# Patient Record
Sex: Female | Born: 1975 | Race: White | Hispanic: No | Marital: Married | Smoking: Never smoker
Health system: Southern US, Community
[De-identification: ages and names within clinical notes are randomized; demographics above are authoritative.]

## PROBLEM LIST (undated history)

## (undated) DIAGNOSIS — E119 Type 2 diabetes mellitus without complications: Secondary | ICD-10-CM

## (undated) DIAGNOSIS — Z8669 Personal history of other diseases of the nervous system and sense organs: Secondary | ICD-10-CM

## (undated) DIAGNOSIS — N9419 Other specified dyspareunia: Secondary | ICD-10-CM

## (undated) DIAGNOSIS — N83209 Unspecified ovarian cyst, unspecified side: Secondary | ICD-10-CM

## (undated) DIAGNOSIS — T7840XA Allergy, unspecified, initial encounter: Secondary | ICD-10-CM

## (undated) HISTORY — DX: Other specified dyspareunia: N94.19

## (undated) HISTORY — DX: Personal history of other diseases of the nervous system and sense organs: Z86.69

## (undated) HISTORY — DX: Unspecified ovarian cyst, unspecified side: N83.209

## (undated) HISTORY — DX: Type 2 diabetes mellitus without complications: E11.9

## (undated) HISTORY — DX: Allergy, unspecified, initial encounter: T78.40XA

---

## 2015-05-03 DIAGNOSIS — F419 Anxiety disorder, unspecified: Secondary | ICD-10-CM

## 2015-05-03 DIAGNOSIS — F32A Depression, unspecified: Secondary | ICD-10-CM | POA: Insufficient documentation

## 2015-05-03 DIAGNOSIS — I10 Essential (primary) hypertension: Secondary | ICD-10-CM | POA: Insufficient documentation

## 2015-05-03 DIAGNOSIS — F329 Major depressive disorder, single episode, unspecified: Secondary | ICD-10-CM | POA: Insufficient documentation

## 2015-07-04 DIAGNOSIS — J309 Allergic rhinitis, unspecified: Secondary | ICD-10-CM | POA: Insufficient documentation

## 2015-10-19 DIAGNOSIS — B370 Candidal stomatitis: Secondary | ICD-10-CM | POA: Insufficient documentation

## 2015-11-02 ENCOUNTER — Encounter: Payer: Self-pay | Admitting: Sports Medicine

## 2015-11-02 ENCOUNTER — Encounter (INDEPENDENT_AMBULATORY_CARE_PROVIDER_SITE_OTHER): Payer: Self-pay

## 2015-11-02 ENCOUNTER — Ambulatory Visit (INDEPENDENT_AMBULATORY_CARE_PROVIDER_SITE_OTHER): Payer: BLUE CROSS/BLUE SHIELD | Admitting: Sports Medicine

## 2015-11-02 ENCOUNTER — Ambulatory Visit (INDEPENDENT_AMBULATORY_CARE_PROVIDER_SITE_OTHER): Payer: BLUE CROSS/BLUE SHIELD

## 2015-11-02 VITALS — BP 143/82 | HR 81 | Resp 18

## 2015-11-02 DIAGNOSIS — E1165 Type 2 diabetes mellitus with hyperglycemia: Secondary | ICD-10-CM | POA: Diagnosis not present

## 2015-11-02 DIAGNOSIS — IMO0002 Reserved for concepts with insufficient information to code with codable children: Secondary | ICD-10-CM | POA: Insufficient documentation

## 2015-11-02 DIAGNOSIS — M7672 Peroneal tendinitis, left leg: Secondary | ICD-10-CM | POA: Diagnosis not present

## 2015-11-02 DIAGNOSIS — IMO0001 Reserved for inherently not codable concepts without codable children: Secondary | ICD-10-CM

## 2015-11-02 DIAGNOSIS — R52 Pain, unspecified: Secondary | ICD-10-CM

## 2015-11-02 DIAGNOSIS — J988 Other specified respiratory disorders: Secondary | ICD-10-CM | POA: Insufficient documentation

## 2015-11-02 MED ORDER — METHYLPREDNISOLONE 4 MG PO TBPK: ORAL_TABLET | ORAL | 0 refills | Status: DC

## 2015-11-02 MED ORDER — DICLOFENAC SODIUM 75 MG PO TBEC: 75.0000 mg | DELAYED_RELEASE_TABLET | Freq: Two times a day (BID) | ORAL | 0 refills | Status: DC

## 2015-11-02 NOTE — Progress Notes (Signed)
Subjective: Lori Stephens is a 40 y.o. female patient who presents to office for evaluation of left foot pain. Patient complains of progressive pain since the end of July. Reports that it is difficult to walk and really injured foot when she was on vacation where she rolled the ankle and has had pain on the lateral side of her foot and ankle. Reports that she was treated by Dr. Thomasena Stephens, who x-rayed her and also gave her a Kenalog shot in her Botox states that the shot helped for a few days but then pain recurred and re-flared states that nothing up until this point has helped currently uses Nike tennis shoes. Admits to a history of plantar fasciitis. States that pain that she currently has on the side of her left foot is sharp, stabbing with walking and throbbing with sitting. States that pain is persistent and has not improved.   Patient admits to history of Diabetes that is uncontrolled and is going to see a Endocrinologist; blood sugars ranges from 300-500 in the evening.  Review of systems as in nursing note   There are no active problems to display for this patient.   No current outpatient prescriptions on file prior to visit.   No current facility-administered medications on file prior to visit.     Allergies  Allergen Reactions  . Guaifenesin Itching  . Prednisone Other (See Comments)    Makes her emotional/cries alot Makes her emotional/cries alot Insomnia  . Clarithromycin Nausea And Vomiting  . Codeine Itching    Hives    Objective:  General: Alert and oriented x3 in no acute distress  Dermatology: No open lesions bilateral lower extremities, no webspace macerations, no ecchymosis bilateral, all nails x 10 are well manicured.  Vascular: Dorsalis Pedis and Posterior Tibial pedal pulses palpable, Capillary Fill Time 3 seconds,(+) pedal hair growth bilateral, no Focal edema bilateral lower extremities, Temperature gradient within normal limits.  Neurology: Lori Stephens sensation  intact via light touch bilateral, Protective sensation intact with Lori Stephens Monofilament to all pedal sites, Position sense intact, vibratory slightly diminished bilateral.  Musculoskeletal: Moderate tenderness with palpation peroneal tendon course left foot with associated weakness on range of motion, there is mild guarding to left foot and ankle pain, worse with stressing midtarsal joint on left.,No pain with calf compression bilateral.   Gait: Antalgic gait  Xrays  Left Foot   Impression: Normal osseous mineralization. There is significant calcaneal spur. No fracture or dislocation, soft tissue margins within normal limits. No other acute findings.  Assessment and Plan: Problem List Items Addressed This Visit    None    Visit Diagnoses    Pain    -  Primary   Relevant Medications   methylPREDNISolone (MEDROL DOSEPAK) 4 MG TBPK tablet   diclofenac (VOLTAREN) 75 MG EC tablet   Other Relevant Orders   DG Foot Complete Left   Peroneal tendonitis of left lower extremity       Possible tear   Relevant Medications   methylPREDNISolone (MEDROL DOSEPAK) 4 MG TBPK tablet   diclofenac (VOLTAREN) 75 MG EC tablet   Uncontrolled diabetes mellitus type 2 without complications, unspecified long term insulin use status (HCC)       Relevant Medications   glipiZIDE (GLUCOTROL XL) 10 MG 24 hr tablet   LANTUS 100 UNIT/ML injection   aspirin EC 81 MG tablet      -Complete examination performed -Xrays reviewed -Discussed treatement options For peroneal tendinitis versus tear, left foot -Ordered MRI  eval for possible tear peroneal tendon, left foot -Rx diclofenac to start after Medrol dose pack is completed -Dispense short cam boot to use when attempting ambulation at all times to protect left foot -Recommend rest, protection, ice, elevation daily -Temporary handicap permit given. Advised patient to refrain from excessive walking, especially with college classes -Patient to return to  office after MRI or sooner if condition worsens.  Lori Islamitorya Lori Stephens, DPM

## 2015-11-02 NOTE — Patient Instructions (Signed)
Walking Boot °A walking boot (controlled ankle motion boot or CAM walker) is a removable boot-shaped splint that holds your foot or ankle in place after an injury or a medical procedure. This helps with healing and prevents further injury. A walking boot has a stiff, rigid outer frame that limits movement and supports your leg and foot. The inner lining is a layer of padded material. Walking boots usually have several adjustable straps to secure them over the foot. °Your health care provider may prescribe a walking boot if it is okay for you to use your injured foot to support your body weight. How much you can walk with the boot on will depend on the type and severity of your injury. Your health care provider will recommend the best boot for you based on your condition. °HOW DO I PUT ON MY WALKING BOOT? °There are different types of walking boots. Each type of boot has specific instructions about how to wear it properly. Follow instructions from your health care provider about wearing yours. In general: °· Sit down to put on your boot. This is more comfortable and it helps to prevent falls. °· Open up the boot fully. Place your foot into the boot so that your heel rests against the back. °· Your toes should be supported by the base of the boot, but they should not hang over the front. °· Adjust the straps so the boot fits securely but is not too tight. °· Do not bend the hard frame of the boot to get a good fit. °· Ask someone to help you put on the boot, if needed. °WHAT ARE SOME TIPS FOR WALKING WITH A WALKING BOOT? °· Do not try to walk without wearing the boot unless your health care provider has approved. °· Rest your injured leg as much as possible. °· Use other assistive walking devices as told by your health care provider. These include crutches and canes. °· On your other foot, wear a shoe with a heel that is close to the height of the boot. °· Be very careful when walking on surfaces that are uneven or  wet. °HOW CAN I REDUCE SWELLING? °· Rest your injured foot or leg as much as possible. °· If directed, apply ice to the injured area: °¨ Put ice in a plastic bag. °¨ Place a towel between your skin and the bag. °¨ Leave the ice on for 20 minutes, 2-3 times a day for two days or as told by your health care provider. °· Keep your injured leg raised (elevated) above the level of your heart for 2-3 hours each day or as told by your health care provider. °· If swelling gets worse, loosen the boot and rest and raise your foot. °· Contact your health care provider if swelling does not get better or if it gets worse over time. °WHAT SKIN CARE PRACTICES SHOULD I FOLLOW? °· Wear a long sock to protect your foot and leg from rubbing inside the boot. °· Take off the boot one time per day to check the injured area. °· Follow instructions from your health care provider about taking care of your incision or wound, if this applies. °· Clean and wash the injured area as told by your health care provider. °· Gently dry your foot and leg before putting the boot back on. °· Contact your health care provider if a wound is getting worse or if your skin becomes red, painful, or irritated. °ARE THERE ANY ACTIVITY RESTRICTIONS? °Activity   restrictions depend on the type and severity of your injury. Follow instructions from your health care provider. °· Bathe and shower as directed by your health care provider. °· Do not do activities that could make your injury worse. °· Do not drive if your affected foot is one that you usually use for driving. °HOW SHOULD I KEEP MY BOOT CLEAN? °· Clean the frame and the liner of the boot by hand. Use a washcloth with mild soap and water. °· Do not use chemical cleaning products. These could irritate your skin, especially if you have a wound or an incision. °· Do not soak the liner of the boot. °· Do not put any part of the boot in a washing machine or a clothes dryer. °· Allow the boot to air dry  completely before you put it back on your foot. °  °This information is not intended to replace advice given to you by your health care provider. Make sure you discuss any questions you have with your health care provider. °  °Document Released: 06/27/2014 Document Reviewed: 06/27/2014 °Elsevier Interactive Patient Education ©2016 [employer] ° °

## 2015-11-02 NOTE — Progress Notes (Signed)
   Subjective:    Patient ID: Lori Stephens, female    DOB: 1975/10/18, 40 y.o.   MRN: [id number]  HPI    Review of Systems  All other systems reviewed and are negative.      Objective:   Physical Exam        Assessment & Plan:

## 2015-11-05 ENCOUNTER — Encounter: Payer: Self-pay | Admitting: Endocrinology

## 2015-11-05 ENCOUNTER — Ambulatory Visit (INDEPENDENT_AMBULATORY_CARE_PROVIDER_SITE_OTHER): Payer: BLUE CROSS/BLUE SHIELD | Admitting: Endocrinology

## 2015-11-05 DIAGNOSIS — Z794 Long term (current) use of insulin: Secondary | ICD-10-CM | POA: Diagnosis not present

## 2015-11-05 DIAGNOSIS — E119 Type 2 diabetes mellitus without complications: Secondary | ICD-10-CM | POA: Diagnosis not present

## 2015-11-05 MED ORDER — METFORMIN HCL ER 500 MG PO TB24: 500.0000 mg | ORAL_TABLET | Freq: Every day | ORAL | 3 refills | Status: DC

## 2015-11-05 MED ORDER — LANTUS 100 UNIT/ML ~~LOC~~ SOLN: 70.0000 [IU] | SUBCUTANEOUS | 11 refills | Status: DC

## 2015-11-05 NOTE — Progress Notes (Signed)
Subjective:    Patient ID: Lori Stephens, female    DOB: 1975/03/09, 40 y.o.   MRN: [id number]  HPI pt states DM was dx'ed in 2009; she has mild if any neuropathy of the lower extremities; she is unaware of any associated chronic complications; she has been on insulin since dx; pt says her diet is good, but exercise has been limited by recent left left ankle/foot injury; she has never had GDM, pancreatitis, severe hypoglycemia or DKA. She did not tolerate metformin (diarrhea).  She says cbg's vary from 70-500.  It is in general higher as the day goes on.    Past Medical History:  Diagnosis Date  . Allergy   . Diabetes mellitus without complication (HCC)   . 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 Hx of migraines   . Ovarian cyst     No past surgical history on file.  Social History   Social History  . Marital status: Married    Spouse name: N/A  . Number of children: N/A  . Years of education: N/A   Occupational History  . Not on file.   Social History Main Topics  . Smoking status: Never Smoker  . Smokeless tobacco: Never Used  . Alcohol use Not on file  . Drug use: Unknown  . Sexual activity: Not on file   Other Topics Concern  . Not on file   Social History Narrative  . No narrative on file    Current Outpatient Prescriptions on File Prior to Visit  Medication Sig Dispense Refill  . amoxicillin-clavulanate (AUGMENTIN) 875-125 MG tablet     . aspirin EC 81 MG tablet Take 81 mg by mouth daily.    . busPIRone (BUSPAR) 10 MG tablet     . cetirizine (ZYRTEC) 10 MG tablet Take 10 mg by mouth daily.    Marland Kitchen. esomeprazole (NEXIUM) 40 MG capsule     . fluticasone (FLONASE) 50 MCG/ACT nasal spray     . metoprolol (LOPRESSOR) 100 MG tablet     . RELION INSULIN SYRINGE 29G X 1/2" 1 ML MISC     . vitamin B-6 (PYRIDOXINE) 25 MG tablet Take 25 mg by mouth daily.     No current facility-administered medications on file prior to visit.     Allergies  Allergen  Reactions  . Guaifenesin Itching  . Prednisone Other (See Comments)    Makes her emotional/cries alot Makes her emotional/cries alot Insomnia  . Clarithromycin Nausea And Vomiting  . Codeine Itching    Hives    Family History  Problem Relation Age of Onset  . Diabetes Mother   . Diabetes Father     BP 134/86   Pulse 88   Wt 246 lb (111.6 kg)   SpO2 95%   Review of Systems denies weight loss, blurry vision, headache, chest pain, sob, n/v, urinary frequency, muscle cramps, excessive diaphoresis, cold intolerance, and easy bruising.  Anxiety is well-controlled.  She has rhinorrhea.      Objective:   Physical Exam VS: see vs page GEN: no distress HEAD: head: no deformity eyes: no periorbital swelling, no proptosis external nose and ears are normal mouth: no lesion seen NECK: supple, thyroid is not enlarged CHEST WALL: no deformity LUNGS: clear to auscultation CV: reg rate and rhythm, no murmur.  ABD: abdomen is soft, nontender.  no hepatosplenomegaly.  not distended.  no hernia (exam limited by inability to position pt on table, and by obesity).  MUSCULOSKELETAL:  muscle bulk and strength are grossly normal.  no obvious joint swelling.  gait is normal and steady EXTEMITIES:no deformity of the right foot.  no ulcer on the right foot.  normal color and temp on the right foot.  1+ right leg edema.  Left leg/foot are in a brace.  PULSES: right dorsalis pedis intact. no carotid bruit NEURO:  cn 2-12 grossly intact.   readily moves all 4's.  sensation is intact to touch on the right foot.  SKIN:  Normal texture and temperature.  No rash or suspicious lesion is visible.   NODES:  None palpable at the neck.  PSYCH: alert, well-oriented.  Does not appear anxious nor depressed.     outside test results are reviewed:  A1c=8.7%  I have reviewed outside records, and summarized: Pt started prednisone a few days ago, for left foot injury.      Assessment & Plan:  Insulin-requiring  type 2 DM: she needs increased rx.  She declines multiple daily injections.   Obesity: new to me.  she declines surgery.

## 2015-11-05 NOTE — Patient Instructions (Addendum)
good diet and exercise significantly improve the control of your diabetes.  please let me know if you wish to be referred to a dietician.  high blood sugar is very risky to your health.  you should see an eye doctor and dentist every year.  It is very important to get all recommended vaccinations.  Controlling your blood pressure and cholesterol drastically reduces the damage diabetes does to your body.  Those who smoke should quit.  Please discuss these with your doctor.  check your blood sugar twice a day.  vary the time of day when you check, between before the 3 meals, and at bedtime.  also check if you have symptoms of your blood sugar being too high or too low.  please keep a record of the readings and bring it to your next appointment here (or you can bring the meter itself).  You can write it on any piece of paper.  please call us sooner if your blood sugar goes below 70, or if you have a lot of readings over 200.   For now: Change to lantus to 70 units each morninmg, and: Change the glipizide to metformin-XR, 500 mg daily.  Please come back for a follow-up appointment in 2 weeks.   Please call us in a few days, to tell us how the blood sugar is doing.

## 2015-11-06 DIAGNOSIS — E119 Type 2 diabetes mellitus without complications: Secondary | ICD-10-CM

## 2015-11-06 DIAGNOSIS — E1140 Type 2 diabetes mellitus with diabetic neuropathy, unspecified: Secondary | ICD-10-CM | POA: Insufficient documentation

## 2015-11-07 ENCOUNTER — Other Ambulatory Visit: Payer: Self-pay

## 2015-11-07 DIAGNOSIS — M7672 Peroneal tendinitis, left leg: Secondary | ICD-10-CM

## 2015-11-07 DIAGNOSIS — R52 Pain, unspecified: Secondary | ICD-10-CM

## 2015-11-07 NOTE — Addendum Note (Signed)
Addended by: Marylou MccoyQUINTANA, Braedon Sjogren L on: 11/07/2015 09:12 AM   Modules accepted: Orders

## 2015-11-14 ENCOUNTER — Telehealth: Payer: Self-pay | Admitting: *Deleted

## 2015-11-14 NOTE — Telephone Encounter (Addendum)
Lori Stephens - [facility] states pt would like to know if her MRI has been approved.  I reviewed pt's chart and did not see approval status, informed Malachy Mood to have pt schedule with [facility] [phone], and they would contact our office if approval was needed. Faxed to [facility] [phone]. 03/20/2016-[facility] states have not received the blood work. I reviewed labs, HLA-B27, ANA were the only labs performed, and results received. I called [facility] and the full requested 01/25/2016 labs orders were in the system and sent from [facility], but only the HLA-B27, and ANA were performed and resulted. Faxed HLA-B27 and ANA with note of explanation.

## 2015-11-16 ENCOUNTER — Ambulatory Visit (INDEPENDENT_AMBULATORY_CARE_PROVIDER_SITE_OTHER): Payer: BLUE CROSS/BLUE SHIELD | Admitting: Endocrinology

## 2015-11-16 ENCOUNTER — Encounter: Payer: Self-pay | Admitting: Endocrinology

## 2015-11-16 VITALS — BP 136/88 | HR 76 | Ht 62.0 in | Wt 245.0 lb

## 2015-11-16 DIAGNOSIS — E119 Type 2 diabetes mellitus without complications: Secondary | ICD-10-CM | POA: Diagnosis not present

## 2015-11-16 DIAGNOSIS — Z794 Long term (current) use of insulin: Secondary | ICD-10-CM

## 2015-11-16 LAB — POCT GLYCOSYLATED HEMOGLOBIN (HGB A1C): Hemoglobin A1C: 8.8

## 2015-11-16 MED ORDER — INSULIN NPH (HUMAN) (ISOPHANE) 100 UNIT/ML ~~LOC~~ SUSP: 70.0000 [IU] | SUBCUTANEOUS | 11 refills | Status: DC

## 2015-11-16 MED ORDER — SITAGLIPTIN PHOSPHATE 100 MG PO TABS: 100.0000 mg | ORAL_TABLET | Freq: Every day | ORAL | 11 refills | Status: DC

## 2015-11-16 NOTE — Progress Notes (Signed)
   Subjective:    Patient ID: Lori Stephens, female    DOB: 1975-05-28, 40 y.o.   MRN: [id number]  HPI  Pt returns for f/u of diabetes mellitus: DM type: Insulin-requiring type 2 Dx'ed: 2009 Complications: none Therapy: insulin since dx GDM: never DKA: never Severe hypoglycemia: never Pancreatitis: never Other: She did not tolerate metformin-XR (diarrhea); she declines multiple daily injections; she declines bariatric surgery Interval history: she brings a record of her cbg's which i have reviewed today.  It varies from 130-532.  It is in general higher as the day goes on.  She finished steroid rx last week.   Past Medical History:  Diagnosis Date  . Allergy   . Diabetes mellitus without complication (HCC)   . 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 Hx of migraines   . Ovarian cyst     No past surgical history on file.  Social History   Social History  . Marital status: Married    Spouse name: N/A  . Number of children: N/A  . Years of education: N/A   Occupational History  . Not on file.   Social History Main Topics  . Smoking status: Never Smoker  . Smokeless tobacco: Never Used  . Alcohol use Not on file  . Drug use: Unknown  . Sexual activity: Not on file   Other Topics Concern  . Not on file   Social History Narrative  . No narrative on file    Current Outpatient Prescriptions on File Prior to Visit  Medication Sig Dispense Refill  . aspirin EC 81 MG tablet Take 81 mg by mouth daily.    . busPIRone (BUSPAR) 10 MG tablet     . cetirizine (ZYRTEC) 10 MG tablet Take 10 mg by mouth daily.    Marland Kitchen. esomeprazole (NEXIUM) 40 MG capsule     . fluticasone (FLONASE) 50 MCG/ACT nasal spray     . metoprolol (LOPRESSOR) 100 MG tablet     . RELION INSULIN SYRINGE 29G X 1/2" 1 ML MISC     . vitamin B-6 (PYRIDOXINE) 25 MG tablet Take 25 mg by mouth daily.     No current facility-administered medications on file prior to visit.     Allergies  Allergen  Reactions  . Guaifenesin Itching  . Prednisone Other (See Comments)    Makes her emotional/cries alot Makes her emotional/cries alot Insomnia  . Clarithromycin Nausea And Vomiting  . Codeine Itching    Hives    Family History  Problem Relation Age of Onset  . Diabetes Mother   . Diabetes Father     BP 136/88   Pulse 76   Ht 5\' 2"  (1.575 m)   Wt 245 lb (111.1 kg)   SpO2 98%   BMI 44.81 kg/m   Review of Systems She denies hypoglycemia.  Diarrhea has recurred.     Objective:   Physical Exam VITAL SIGNS:  See vs page GENERAL: no distress SKIN:  Insulin injection sites at the anterior abdomen are normal, except for a few ecchymoses.   Left ankle is in a boot  A1c=8.8%    Assessment & Plan:  Diarrhea: this precludes meformin rx Insulin-requiring type 2 DM: she needs increased rx

## 2015-11-16 NOTE — Patient Instructions (Addendum)
check your blood sugar twice a day.  vary the time of day when you check, between before the 3 meals, and at bedtime.  also check if you have symptoms of your blood sugar being too high or too low.  please keep a record of the readings and bring it to your next appointment here (or you can bring the meter itself).  You can write it on any piece of paper.  please call us sooner if your blood sugar goes below 70, or if you have a lot of readings over 200. Change to lantus to NPH, 70 units each morninmg, and:  I have sent a prescription to your pharmacy, to change metformin to [place]uvia.   The effect of the steroids will wear off over the next few days, so be on the lookout for lows.   Please come back for a follow-up appointment in 1 month.  Please call us next week, to tell us how the blood sugar is doing.

## 2015-11-23 ENCOUNTER — Encounter: Payer: Self-pay | Admitting: Sports Medicine

## 2015-11-23 ENCOUNTER — Ambulatory Visit (INDEPENDENT_AMBULATORY_CARE_PROVIDER_SITE_OTHER): Payer: BLUE CROSS/BLUE SHIELD | Admitting: Sports Medicine

## 2015-11-23 DIAGNOSIS — M76822 Posterior tibial tendinitis, left leg: Secondary | ICD-10-CM

## 2015-11-23 DIAGNOSIS — M7672 Peroneal tendinitis, left leg: Secondary | ICD-10-CM | POA: Diagnosis not present

## 2015-11-23 DIAGNOSIS — M729 Fibroblastic disorder, unspecified: Secondary | ICD-10-CM | POA: Diagnosis not present

## 2015-11-23 DIAGNOSIS — T148 Other injury of unspecified body region: Secondary | ICD-10-CM | POA: Diagnosis not present

## 2015-11-23 DIAGNOSIS — T148XXA Other injury of unspecified body region, initial encounter: Secondary | ICD-10-CM

## 2015-11-23 NOTE — Progress Notes (Signed)
Subjective: Lori Stephens is a 40 y.o. diabetic female patient who returns to office for evaluation of left foot pain. Patient had MRI and is here for discussion of results. Patient reports that pain is still constant in the foot and that 2 weeks ago experience along with the pain a sharp burning sensation which is new in nature. States that she has felt no difference with the medications. Reports most relief with use of cam boot. Patient denies any other pedal complaints at this time.    Patient Active Problem List   Diagnosis Date Noted  . Type 2 diabetes mellitus without complication (HCC) 11/06/2015  . Paronychia 11/02/2015  . Respiratory infection 11/02/2015  . Oral thrush 10/19/2015  . Allergic rhinitis 07/04/2015  . Anxiety and depression 05/03/2015  . Essential hypertension 05/03/2015    Current Outpatient Prescriptions on File Prior to Visit  Medication Sig Dispense Refill  . aspirin EC 81 MG tablet Take 81 mg by mouth daily.    . busPIRone (BUSPAR) 10 MG tablet     . cetirizine (ZYRTEC) 10 MG tablet Take 10 mg by mouth daily.    Marland Kitchen esomeprazole (NEXIUM) 40 MG capsule     . fluticasone (FLONASE) 50 MCG/ACT nasal spray     . insulin NPH Human (NOVOLIN N) 100 UNIT/ML injection Inject 0.7 mLs (70 Units total) into the skin every morning. 30 mL 11  . levocetirizine (XYZAL) 5 MG tablet Take 5 mg by mouth every evening.    . metoprolol (LOPRESSOR) 100 MG tablet     . RELION INSULIN SYRINGE 29G X 1/2" 1 ML MISC     . sitaGLIPtin (JANUVIA) 100 MG tablet Take 1 tablet (100 mg total) by mouth daily. 30 tablet 11  . vitamin B-6 (PYRIDOXINE) 25 MG tablet Take 25 mg by mouth daily.     No current facility-administered medications on file prior to visit.     Allergies  Allergen Reactions  . Guaifenesin Itching  . Prednisone Other (See Comments)    Makes her emotional/cries alot Makes her emotional/cries alot Insomnia  . Clarithromycin Nausea And Vomiting  . Codeine Itching   Hives    Objective:  General: Alert and oriented x3 in no acute distress  Dermatology: No open lesions bilateral lower extremities, no webspace macerations, no ecchymosis bilateral, all nails x 10 are well manicured.  Vascular: Dorsalis Pedis and Posterior Tibial pedal pulses palpable, Capillary Fill Time 3 seconds,(+) pedal hair growth bilateral, no focal edema bilateral lower extremities, Temperature gradient within normal limits.  Neurology: [name] sensation intact via light touch bilateral, Protective sensation intact with Phoebe Perch Monofilament to all pedal sites, Position sense intact, vibratory slightly diminished bilateral.  Musculoskeletal: Mild tenderness with palpation plantar lateral heel and peroneal tendon course left foot with associated weakness on range of motion, there is mild guarding to left foot and ankle pain, worse with stressing midtarsal joint on left. No pain with calf compression bilateral.   Gait: Antalgic gait  MRI: Peroneal brevis synovitis without tear, partial muscle tear abductor digiti minimi corresponding to lateral foot pain, synovitis , posterior tibial tendon, minimal achilles tendonopathy and fasciitis.   Assessment and Plan: Problem List Items Addressed This Visit    None    Visit Diagnoses    Peroneal tendonitis of left lower extremity    -  Primary   Posterior tibial tendinitis of left leg       Fasciitis       Muscle tear  Abductor digiti quinti minimi partial tear      -Complete examination performed -MRI reviewed -Discussed treatement options for tendinitis with tear of muscle belly -Patient to continue with diclofenac until completed -Prescribed physical therapy with treatment modalities -Gave surgi tube compression stocking for compression and support -Continue with short cam boot to use when attempting ambulation at all times to protect left foot; Patient may come out of boot during therapy sessions -Recommend rest,  protection, ice, elevation daily, and topical pain creams and rubs over-the-counter as needed -Continue with Temporary handicap. Advised patient to refrain from excessive walking, especially with college classes until symptoms resolve -Patient to return to office in 4 weeks for follow up evaluation after starting physical therapy or sooner if condition worsens.  Asencion Islamitorya Faizah Kandler, DPM

## 2015-12-07 ENCOUNTER — Ambulatory Visit: Payer: BLUE CROSS/BLUE SHIELD | Admitting: Endocrinology

## 2015-12-19 ENCOUNTER — Telehealth: Payer: Self-pay | Admitting: Endocrinology

## 2015-12-19 NOTE — Telephone Encounter (Signed)
I contacted the patient and advised of message. Patient voiced understanding and had no further questions at this time.  

## 2015-12-19 NOTE — Telephone Encounter (Signed)
please call patient: I got your cbg record Please increase to 85 units qam I'll see you next time.

## 2015-12-21 ENCOUNTER — Ambulatory Visit: Payer: BLUE CROSS/BLUE SHIELD | Admitting: Sports Medicine

## 2015-12-21 ENCOUNTER — Ambulatory Visit: Payer: BLUE CROSS/BLUE SHIELD | Admitting: Endocrinology

## 2015-12-28 ENCOUNTER — Encounter: Payer: Self-pay | Admitting: Sports Medicine

## 2015-12-28 ENCOUNTER — Ambulatory Visit (INDEPENDENT_AMBULATORY_CARE_PROVIDER_SITE_OTHER): Payer: BLUE CROSS/BLUE SHIELD | Admitting: Sports Medicine

## 2015-12-28 ENCOUNTER — Telehealth: Payer: Self-pay | Admitting: *Deleted

## 2015-12-28 DIAGNOSIS — E1165 Type 2 diabetes mellitus with hyperglycemia: Secondary | ICD-10-CM

## 2015-12-28 DIAGNOSIS — M76822 Posterior tibial tendinitis, left leg: Secondary | ICD-10-CM

## 2015-12-28 DIAGNOSIS — IMO0001 Reserved for inherently not codable concepts without codable children: Secondary | ICD-10-CM

## 2015-12-28 DIAGNOSIS — T148XXA Other injury of unspecified body region, initial encounter: Secondary | ICD-10-CM

## 2015-12-28 DIAGNOSIS — M7672 Peroneal tendinitis, left leg: Secondary | ICD-10-CM | POA: Diagnosis not present

## 2015-12-28 DIAGNOSIS — M729 Fibroblastic disorder, unspecified: Secondary | ICD-10-CM | POA: Diagnosis not present

## 2015-12-28 DIAGNOSIS — R52 Pain, unspecified: Secondary | ICD-10-CM

## 2015-12-28 MED ORDER — MELOXICAM 15 MG PO TABS: 15.0000 mg | ORAL_TABLET | Freq: Every day | ORAL | 0 refills | Status: DC

## 2015-12-28 NOTE — Progress Notes (Signed)
Subjective: Seymour BarsMisty B Botkins is a 40 y.o. diabetic female patient who returns to office for evaluation of left foot pain. Patient states that she had a flare up in pain around 12-14-15 when helping mom at yard sale and since continues to be sore and painful. Patient denies any other pedal complaints at this time.    Patient Active Problem List   Diagnosis Date Noted  . Type 2 diabetes mellitus without complication (HCC) 11/06/2015  . Paronychia 11/02/2015  . Respiratory infection 11/02/2015  . Oral thrush 10/19/2015  . Allergic rhinitis 07/04/2015  . Anxiety and depression 05/03/2015  . Essential hypertension 05/03/2015    Current Outpatient Prescriptions on File Prior to Visit  Medication Sig Dispense Refill  . aspirin EC 81 MG tablet Take 81 mg by mouth daily.    . busPIRone (BUSPAR) 10 MG tablet     . cetirizine (ZYRTEC) 10 MG tablet Take 10 mg by mouth daily.    Marland Kitchen. esomeprazole (NEXIUM) 40 MG capsule     . fluticasone (FLONASE) 50 MCG/ACT nasal spray     . insulin NPH Human (NOVOLIN N) 100 UNIT/ML injection Inject 0.7 mLs (70 Units total) into the skin every morning. 30 mL 11  . levocetirizine (XYZAL) 5 MG tablet Take 5 mg by mouth every evening.    . metoprolol (LOPRESSOR) 100 MG tablet     . RELION INSULIN SYRINGE 29G X 1/2" 1 ML MISC     . sitaGLIPtin (JANUVIA) 100 MG tablet Take 1 tablet (100 mg total) by mouth daily. 30 tablet 11  . vitamin B-6 (PYRIDOXINE) 25 MG tablet Take 25 mg by mouth daily.     No current facility-administered medications on file prior to visit.     Allergies  Allergen Reactions  . Guaifenesin Itching  . Prednisone Other (See Comments)    Makes her emotional/cries alot Makes her emotional/cries alot Insomnia  . Clarithromycin Nausea And Vomiting  . Codeine Itching    Hives    Objective:  General: Alert and oriented x3 in no acute distress  Dermatology: No open lesions bilateral lower extremities, no webspace macerations, no ecchymosis  bilateral, all nails x 10 are well manicured.  Vascular: Dorsalis Pedis and Posterior Tibial pedal pulses palpable, Capillary Fill Time 3 seconds,(+) pedal hair growth bilateral, no focal edema bilateral lower extremities, Temperature gradient within normal limits.  Neurology: Michaell CowingGross sensation intact via light touch bilateral, Protective sensation intact with Phoebe PerchSemmes Weinstein Monofilament to all pedal sites, Position sense intact, vibratory slightly diminished bilateral.  Musculoskeletal: Mild tenderness with palpation plantar lateral heel and peroneal tendon course left foot with associated weakness on range of motion, there is mild guarding to left foot and ankle pain, worse with stressing midtarsal joint on left. No pain with calf compression bilateral.   Gait: Antalgic gait  Assessment and Plan: Problem List Items Addressed This Visit    None    Visit Diagnoses    Peroneal tendonitis of left lower extremity    -  Primary   Relevant Medications   meloxicam (MOBIC) 15 MG tablet   Posterior tibial tendinitis of left leg       Relevant Medications   meloxicam (MOBIC) 15 MG tablet   Fasciitis       Relevant Medications   meloxicam (MOBIC) 15 MG tablet   Muscle tear       Relevant Medications   meloxicam (MOBIC) 15 MG tablet   Pain       Relevant Medications  meloxicam (MOBIC) 15 MG tablet   Uncontrolled diabetes mellitus type 2 without complications, unspecified long term insulin use status (HCC)       Relevant Medications   meloxicam (MOBIC) 15 MG tablet      -Complete examination performed -Discussed continued care for tendinitis with tear of muscle belly -Changed diclofenac to Mobic -Continue with physical therapy with treatment modalities; Called over to [facility] to add on iontopheresis and Rx dexamethasone for patient to use during therapy  -Recommend compression sleeve and support -Changed short to tall cam boot to use when attempting ambulation at all times to protect  left foot; Patient may come out of boot during therapy sessions; If no improvement will consider casting  -Recommend rest, protection, ice, elevation daily, and topical pain creams and rubs over-the-counter as needed -Continue with Temporary handicap. Advised patient to refrain from excessive walking/activity to prevent flare up -Patient to return to office in 3-4 weeks for follow up evaluation a or sooner if condition worsens.  Asencion Islamitorya Allayah Raineri, DPM

## 2015-12-28 NOTE — Telephone Encounter (Deleted)
-----   Message from Asencion Islamitorya Stover, [place] sent at 12/28/2015 12:28 PM EDT ----- Regarding: Dexamethasone for physical therapy Order dexamethasone for patient to take with her for iontophoresis treatments in therapy. Patient's next therapy appointment is on Monday Thanks Dr. Marylene LandStover

## 2015-12-28 NOTE — Telephone Encounter (Addendum)
-----   Message from Asencion Islamitorya Stover, [place] sent at 12/28/2015 12:28 PM EDT ----- Regarding: Dexamethasone for physical therapy Order dexamethasone for patient to take with her for iontophoresis treatments in therapy. Patient's next therapy appointment is on Monday Thanks Dr. Marylene LandStover. Called pt was interrupted before could leave a message. Pt called and I asked where she was set up for PT, she [place] [facility] [place] and gave [phone] as contact number. Barbara CowerJason - [facility] state [facility] [phone] and [place] [phone]. [facility] Pharmacist states can fill the rx in their pharmacy. Faxed rx written: Dexamethasone 0.4% liquid 30ml to be used for Iontophoresis during physical therapy sessions. Left message informing pt [facility] [phone].

## 2016-01-16 ENCOUNTER — Ambulatory Visit (INDEPENDENT_AMBULATORY_CARE_PROVIDER_SITE_OTHER): Payer: BLUE CROSS/BLUE SHIELD | Admitting: Sports Medicine

## 2016-01-16 ENCOUNTER — Encounter: Payer: Self-pay | Admitting: Sports Medicine

## 2016-01-16 DIAGNOSIS — M76822 Posterior tibial tendinitis, left leg: Secondary | ICD-10-CM | POA: Diagnosis not present

## 2016-01-16 DIAGNOSIS — M729 Fibroblastic disorder, unspecified: Secondary | ICD-10-CM

## 2016-01-16 DIAGNOSIS — R52 Pain, unspecified: Secondary | ICD-10-CM

## 2016-01-16 DIAGNOSIS — E1165 Type 2 diabetes mellitus with hyperglycemia: Secondary | ICD-10-CM | POA: Diagnosis not present

## 2016-01-16 DIAGNOSIS — IMO0001 Reserved for inherently not codable concepts without codable children: Secondary | ICD-10-CM

## 2016-01-16 DIAGNOSIS — T148XXA Other injury of unspecified body region, initial encounter: Secondary | ICD-10-CM

## 2016-01-16 DIAGNOSIS — M7672 Peroneal tendinitis, left leg: Secondary | ICD-10-CM | POA: Diagnosis not present

## 2016-01-16 NOTE — Progress Notes (Signed)
Subjective: Seymour BarsMisty B Spychalski is a 40 y.o. diabetic female patient who returns to office for evaluation of left foot pain. Patient states that she continues with PT and continues to be sore with little improvement. Patient states that she missed her last appointment of therapy that was scheduled for today.Patient denies any other pedal complaints at this time.    Patient Active Problem List   Diagnosis Date Noted  . Type 2 diabetes mellitus without complication (HCC) 11/06/2015  . Paronychia 11/02/2015  . Respiratory infection 11/02/2015  . Oral thrush 10/19/2015  . Allergic rhinitis 07/04/2015  . Anxiety and depression 05/03/2015  . Essential hypertension 05/03/2015    Current Outpatient Prescriptions on File Prior to Visit  Medication Sig Dispense Refill  . aspirin EC 81 MG tablet Take 81 mg by mouth daily.    . busPIRone (BUSPAR) 10 MG tablet     . cetirizine (ZYRTEC) 10 MG tablet Take 10 mg by mouth daily.    Marland Kitchen. esomeprazole (NEXIUM) 40 MG capsule     . fluticasone (FLONASE) 50 MCG/ACT nasal spray     . insulin NPH Human (NOVOLIN N) 100 UNIT/ML injection Inject 0.7 mLs (70 Units total) into the skin every morning. 30 mL 11  . levocetirizine (XYZAL) 5 MG tablet Take 5 mg by mouth every evening.    . meloxicam (MOBIC) 15 MG tablet Take 1 tablet (15 mg total) by mouth daily. 30 tablet 0  . metoprolol (LOPRESSOR) 100 MG tablet     . RELION INSULIN SYRINGE 29G X 1/2" 1 ML MISC     . sitaGLIPtin (JANUVIA) 100 MG tablet Take 1 tablet (100 mg total) by mouth daily. 30 tablet 11  . vitamin B-6 (PYRIDOXINE) 25 MG tablet Take 25 mg by mouth daily.     No current facility-administered medications on file prior to visit.     Allergies  Allergen Reactions  . Guaifenesin Itching  . Prednisone Other (See Comments)    Makes her emotional/cries alot Makes her emotional/cries alot Insomnia  . Clarithromycin Nausea And Vomiting  . Codeine Itching    Hives    Objective:  General: Alert and  oriented x3 in no acute distress  Dermatology: No open lesions bilateral lower extremities, no webspace macerations, no ecchymosis bilateral, all nails x 10 are well manicured.  Vascular: Dorsalis Pedis and Posterior Tibial pedal pulses palpable, Capillary Fill Time 3 seconds,(+) pedal hair growth bilateral, no focal edema bilateral lower extremities however subjective, trace edema at right ankle that is new in nature, Temperature gradient within normal limits.  Neurology: Michaell CowingGross sensation intact via light touch bilateral, Protective sensation intact with Phoebe PerchSemmes Weinstein Monofilament to all pedal sites, Position sense intact, vibratory slightly diminished bilateral.  Musculoskeletal: Mild tenderness with palpation plantar lateral heel and peroneal tendon course left foot with associated weakness on range of motion, there is mild guarding to left foot and ankle pain, worse with stressing midtarsal joint on left. No pain with calf compression bilateral.   Gait: Antalgic gait  Assessment and Plan: Problem List Items Addressed This Visit    None    Visit Diagnoses    Peroneal tendonitis of left lower extremity    -  Primary   Posterior tibial tendinitis of left leg       Fasciitis       Muscle tear       Pain       Uncontrolled diabetes mellitus type 2 without complications, unspecified long term insulin use status (HCC)          -  Complete examination performed -Discussed continued care for tendinitis with tear of muscle belly -Unna boot applied to left continue with CAM boot. Advised patient to keep Unna boot in place for one week after 1 week to cut off and removed and to follow-up the next day with me in office for further evaluation -Continue with Mobic  -Recommend continue with compression sleeve and support for right foot which he is noticing more swelling, which likely secondary to increased sodium intake from fast food -D/c PT for now -Continue with Temporary handicap. Advised  patient to refrain from excessive walking/activity to prevent flare up -Patient to return to office in 8 days for follow up evaluation a or sooner if condition worsens.  Asencion Islamitorya Esmond Hinch, DPM

## 2016-01-20 NOTE — Progress Notes (Deleted)
   Subjective:    Patient ID: Lori BarsMisty B Stephens, female    DOB: [birth year], 40 y.o.   MRN: [id number]  HPI Pt returns for f/u of diabetes mellitus: DM type: Insulin-requiring type 2 Dx'ed: 2009 Complications: none Therapy: insulin since dx GDM: never DKA: never Severe hypoglycemia: never Pancreatitis: never Other: She did not tolerate metformin-XR (diarrhea); she declines multiple daily injections; she declines bariatric surgery Interval history: she brings a record of her cbg's which i have reviewed today.  It varies from 130-532.  It is in general higher as the day goes on.  She finished steroid rx last week.    Review of Systems     Objective:   Physical Exam VITAL SIGNS:  See vs page GENERAL: no distress  EXTEMITIES:no deformity of the right foot.  no ulcer on the right foot.  normal color and temp on the right foot.  1+ right leg edema.  Left leg/foot are in a brace.  PULSES: right dorsalis pedis intact. no carotid bruit NEURO:  cn 2-12 grossly intact.   readily moves all 4's.  sensation is intact to touch on the right foot.        Assessment & Plan:

## 2016-01-21 ENCOUNTER — Ambulatory Visit: Payer: BLUE CROSS/BLUE SHIELD | Admitting: Endocrinology

## 2016-01-25 ENCOUNTER — Ambulatory Visit (INDEPENDENT_AMBULATORY_CARE_PROVIDER_SITE_OTHER): Payer: BLUE CROSS/BLUE SHIELD | Admitting: Sports Medicine

## 2016-01-25 ENCOUNTER — Encounter: Payer: Self-pay | Admitting: Sports Medicine

## 2016-01-25 DIAGNOSIS — M729 Fibroblastic disorder, unspecified: Secondary | ICD-10-CM

## 2016-01-25 DIAGNOSIS — M76822 Posterior tibial tendinitis, left leg: Secondary | ICD-10-CM

## 2016-01-25 DIAGNOSIS — E1165 Type 2 diabetes mellitus with hyperglycemia: Secondary | ICD-10-CM

## 2016-01-25 DIAGNOSIS — M7672 Peroneal tendinitis, left leg: Secondary | ICD-10-CM

## 2016-01-25 DIAGNOSIS — R609 Edema, unspecified: Secondary | ICD-10-CM

## 2016-01-25 DIAGNOSIS — T148XXA Other injury of unspecified body region, initial encounter: Secondary | ICD-10-CM | POA: Diagnosis not present

## 2016-01-25 DIAGNOSIS — IMO0001 Reserved for inherently not codable concepts without codable children: Secondary | ICD-10-CM

## 2016-01-25 DIAGNOSIS — M1990 Unspecified osteoarthritis, unspecified site: Secondary | ICD-10-CM

## 2016-01-25 NOTE — Progress Notes (Signed)
Subjective: Lori Stephens is a 40 y.o. diabetic female patient who returns to office for evaluation of left foot pain. Patient states that she continues to have pain and could only tolerate the unna boot until Wednesday because of itching. Patient states that she missed her last appointment of therapy that was scheduled for today.Patient denies any other pedal complaints at this time.    Patient Active Problem List   Diagnosis Date Noted  . Type 2 diabetes mellitus without complication ([place]) 10/62/6948  . Paronychia 11/02/2015  . Respiratory infection 11/02/2015  . Oral thrush 10/19/2015  . Allergic rhinitis 07/04/2015  . Anxiety and depression 05/03/2015  . Essential hypertension 05/03/2015    Current Outpatient Prescriptions on File Prior to Visit  Medication Sig Dispense Refill  . aspirin EC 81 MG tablet Take 81 mg by mouth daily.    . busPIRone (BUSPAR) 10 MG tablet     . cetirizine (ZYRTEC) 10 MG tablet Take 10 mg by mouth daily.    Marland Kitchen esomeprazole (NEXIUM) 40 MG capsule     . fluticasone (FLONASE) 50 MCG/ACT nasal spray     . insulin NPH Human (NOVOLIN N) 100 UNIT/ML injection Inject 0.7 mLs (70 Units total) into the skin every morning. 30 mL 11  . levocetirizine (XYZAL) 5 MG tablet Take 5 mg by mouth every evening.    . meloxicam (MOBIC) 15 MG tablet Take 1 tablet (15 mg total) by mouth daily. 30 tablet 0  . metoprolol (LOPRESSOR) 100 MG tablet     . RELION INSULIN SYRINGE 29G X 1/2" 1 ML MISC     . sitaGLIPtin (JANUVIA) 100 MG tablet Take 1 tablet (100 mg total) by mouth daily. 30 tablet 11  . vitamin B-6 (PYRIDOXINE) 25 MG tablet Take 25 mg by mouth daily.     No current facility-administered medications on file prior to visit.     Allergies  Allergen Reactions  . Guaifenesin Itching  . Prednisone Other (See Comments)    Makes her emotional/cries alot Makes her emotional/cries alot Insomnia  . Clarithromycin Nausea And Vomiting  . Codeine Itching     Hives Hives Hives    Objective:  General: Alert and oriented x3 in no acute distress  Dermatology: No open lesions bilateral lower extremities, small petichae on left lower leg ? sensitivity to zinc oxide no webspace macerations, no ecchymosis bilateral, all nails x 10 are well manicured.  Vascular: Dorsalis Pedis and Posterior Tibial pedal pulses palpable, Capillary Fill Time 3 seconds,(+) pedal hair growth bilateral, trace edema bilateral, Temperature gradient within normal limits.  Neurology: Johney Maine sensation intact via light touch bilateral, Protective sensation intact with Thornell Mule Monofilament to all pedal sites, Position sense intact, vibratory slightly diminished bilateral.  Musculoskeletal: Mild tenderness with palpation plantar lateral heel and peroneal tendon> PT course left foot with associated weakness on range of motion, there is mild guarding to left foot and ankle pain, worse with stressing midtarsal joint on left. No pain with calf compression bilateral.   Gait: Antalgic gait  Assessment and Plan: Problem List Items Addressed This Visit    None    Visit Diagnoses    Peroneal tendonitis of left lower extremity    -  Primary   Relevant Orders   CBC with Differential   Basic Metabolic Panel   Uric Acid   Sedimentation Rate   HLA-B27 Antigen   C-reactive protein   ANA   Posterior tibial tendinitis of left leg  Relevant Orders   CBC with Differential   Basic Metabolic Panel   Uric Acid   Sedimentation Rate   HLA-B27 Antigen   C-reactive protein   ANA   Fasciitis       Relevant Orders   CBC with Differential   Basic Metabolic Panel   Uric Acid   Sedimentation Rate   HLA-B27 Antigen   C-reactive protein   ANA   Muscle tear       Relevant Orders   CBC with Differential   Basic Metabolic Panel   Uric Acid   Sedimentation Rate   HLA-B27 Antigen   C-reactive protein   ANA   Uncontrolled diabetes mellitus type 2 without complications,  unspecified long term insulin use status (HCC)       Relevant Orders   CBC with Differential   Basic Metabolic Panel   Uric Acid   Sedimentation Rate   HLA-B27 Antigen   C-reactive protein   ANA   Inflammatory arthritis       Relevant Orders   CBC with Differential   Basic Metabolic Panel   Uric Acid   Sedimentation Rate   HLA-B27 Antigen   C-reactive protein   ANA   Swelling          -Complete examination performed -Discussed continued care for tendinitis with tear of muscle belly -Jones compression dressing applied to left continue with CAM boot. Advised patient to keep dressing in place for one week after 1 week to cut off and removed and to follow-up the next day with me in office for further evaluation -Rx Arthritic panel to eval for underlying inflammatory condition  -Continue with Mobic  -Continue with Temporary handicap. Advised patient to refrain from excessive walking/activity to prevent flare up -Patient to return to office in 1 week for follow up evaluation a or sooner if condition worsens. If no improvement may consider trigger point injection.  Landis Martins, DPM

## 2016-01-30 ENCOUNTER — Ambulatory Visit (INDEPENDENT_AMBULATORY_CARE_PROVIDER_SITE_OTHER): Payer: BLUE CROSS/BLUE SHIELD | Admitting: Endocrinology

## 2016-01-30 ENCOUNTER — Encounter: Payer: Self-pay | Admitting: Endocrinology

## 2016-01-30 ENCOUNTER — Telehealth: Payer: Self-pay | Admitting: Endocrinology

## 2016-01-30 VITALS — BP 142/84 | HR 93 | Temp 98.3°F | Ht 62.0 in | Wt 247.0 lb

## 2016-01-30 DIAGNOSIS — E119 Type 2 diabetes mellitus without complications: Secondary | ICD-10-CM | POA: Diagnosis not present

## 2016-01-30 DIAGNOSIS — Z794 Long term (current) use of insulin: Secondary | ICD-10-CM

## 2016-01-30 MED ORDER — INSULIN NPH (HUMAN) (ISOPHANE) 100 UNIT/ML ~~LOC~~ SUSP: 100.0000 [IU] | SUBCUTANEOUS | 11 refills | Status: DC

## 2016-01-30 NOTE — Telephone Encounter (Signed)
Pharmacy called in to verify that the Novolin N amount of the prescription that was sent in is correct, because it is different than she has had in the past.

## 2016-01-30 NOTE — Progress Notes (Signed)
Subjective:    Patient ID: Lori Stephens, female    DOB: 03/20/1975, 40 y.o.   MRN: [id number]  HPI Pt returns for f/u of diabetes mellitus: DM type: Insulin-requiring type 2 Dx'ed: 2009 Complications: none Therapy: insulin since dx GDM: never DKA: never Severe hypoglycemia: never Pancreatitis: never Other: She did not tolerate metformin-XR (diarrhea); she declines multiple daily injections; she declines bariatric surgery; she takes human insulin, due to cost. Interval history: she brings a record of her cbg's which i have reviewed today.  it varies from 117-414.  There is no trend throughout the day, except it is highest at HS.    Past Medical History:  Diagnosis Date  . Allergy   . Diabetes mellitus without complication (HCC)   . 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 Hx of migraines   . Ovarian cyst     No past surgical history on file.  Social History   Social History  . Marital status: Married    Spouse name: N/A  . Number of children: N/A  . Years of education: N/A   Occupational History  . Not on file.   Social History Main Topics  . Smoking status: Never Smoker  . Smokeless tobacco: Never Used  . Alcohol use Not on file  . Drug use: Unknown  . Sexual activity: Not on file   Other Topics Concern  . Not on file   Social History Narrative  . No narrative on file    Current Outpatient Prescriptions on File Prior to Visit  Medication Sig Dispense Refill  . aspirin EC 81 MG tablet Take 81 mg by mouth daily.    . busPIRone (BUSPAR) 10 MG tablet     . cetirizine (ZYRTEC) 10 MG tablet Take 10 mg by mouth daily.    Marland Kitchen. esomeprazole (NEXIUM) 40 MG capsule     . fluticasone (FLONASE) 50 MCG/ACT nasal spray     . levocetirizine (XYZAL) 5 MG tablet Take 5 mg by mouth every evening.    . meloxicam (MOBIC) 15 MG tablet Take 1 tablet (15 mg total) by mouth daily. 30 tablet 0  . metoprolol (LOPRESSOR) 100 MG tablet     . RELION INSULIN SYRINGE 29G X  1/2" 1 ML MISC     . sitaGLIPtin (JANUVIA) 100 MG tablet Take 1 tablet (100 mg total) by mouth daily. 30 tablet 11  . vitamin B-6 (PYRIDOXINE) 25 MG tablet Take 25 mg by mouth daily.     No current facility-administered medications on file prior to visit.     Allergies  Allergen Reactions  . Guaifenesin Itching  . Prednisone Other (See Comments)    Makes her emotional/cries alot Makes her emotional/cries alot Insomnia  . Clarithromycin Nausea And Vomiting  . Codeine Itching    Hives Hives Hives    Family History  Problem Relation Age of Onset  . Diabetes Mother   . Diabetes Father     BP (!) 142/84   Pulse 93   Temp 98.3 F (36.8 C) (Oral)   Ht 5\' 2"  (1.575 m)   Wt 247 lb (112 kg)   LMP  (LMP Unknown)   SpO2 98%   BMI 45.18 kg/m    Review of Systems She denies hypoglycemia.      Objective:   Physical Exam VITAL SIGNS:  See vs page.  GENERAL: no distress.  Left foot is in a cast and boot.  Right foot is normal.  outside test results are reviewed:  A1c=9.0%     Assessment & Plan:  Insulin-requiring type 2 DM: poor control.    Patient is advised the following: Patient Instructions  check your blood sugar twice a day.  vary the time of day when you check, between before the 3 meals, and at bedtime.  also check if you have symptoms of your blood sugar being too high or too low.  please keep a record of the readings and bring it to your next appointment here (or you can bring the meter itself).  You can write it on any piece of paper.  please call us sooner if your blood sugar goes below 70, or if you have a lot of readings over 200. Please increase the NPH to 100 units each morninmg, and:  Please continue the same [place].   Please call us next week, to tell us how the blood sugar is doing.  Please come back for a follow-up appointment in 2 months.

## 2016-01-30 NOTE — Patient Instructions (Addendum)
check your blood sugar twice a day.  vary the time of day when you check, between before the 3 meals, and at bedtime.  also check if you have symptoms of your blood sugar being too high or too low.  please keep a record of the readings and bring it to your next appointment here (or you can bring the meter itself).  You can write it on any piece of paper.  please call us sooner if your blood sugar goes below 70, or if you have a lot of readings over 200. Please increase the NPH to 100 units each morninmg, and:  Please continue the same [place].   Please call us next week, to tell us how the blood sugar is doing.  Please come back for a follow-up appointment in 2 months.

## 2016-01-30 NOTE — Telephone Encounter (Signed)
I contacted the pharmacy and gave the verbal clarification on the novolin N to Inject 1 mL (100 Units total) into the skin every morning. Pharmacist verbalized understanding and had no further questions.

## 2016-02-01 ENCOUNTER — Ambulatory Visit (INDEPENDENT_AMBULATORY_CARE_PROVIDER_SITE_OTHER): Payer: BLUE CROSS/BLUE SHIELD | Admitting: Sports Medicine

## 2016-02-01 ENCOUNTER — Encounter: Payer: Self-pay | Admitting: Sports Medicine

## 2016-02-01 ENCOUNTER — Telehealth: Payer: Self-pay | Admitting: *Deleted

## 2016-02-01 DIAGNOSIS — E1165 Type 2 diabetes mellitus with hyperglycemia: Secondary | ICD-10-CM | POA: Diagnosis not present

## 2016-02-01 DIAGNOSIS — R21 Rash and other nonspecific skin eruption: Secondary | ICD-10-CM

## 2016-02-01 DIAGNOSIS — M7672 Peroneal tendinitis, left leg: Secondary | ICD-10-CM

## 2016-02-01 DIAGNOSIS — T148XXA Other injury of unspecified body region, initial encounter: Secondary | ICD-10-CM

## 2016-02-01 DIAGNOSIS — M729 Fibroblastic disorder, unspecified: Secondary | ICD-10-CM

## 2016-02-01 DIAGNOSIS — IMO0001 Reserved for inherently not codable concepts without codable children: Secondary | ICD-10-CM

## 2016-02-01 DIAGNOSIS — M76822 Posterior tibial tendinitis, left leg: Secondary | ICD-10-CM

## 2016-02-01 DIAGNOSIS — R52 Pain, unspecified: Secondary | ICD-10-CM | POA: Diagnosis not present

## 2016-02-01 MED ORDER — MELOXICAM 15 MG PO TABS: 15.0000 mg | ORAL_TABLET | Freq: Every day | ORAL | 0 refills | Status: DC

## 2016-02-01 MED ORDER — TRIAMCINOLONE ACETONIDE 0.5 % EX OINT: 1.0000 "application " | TOPICAL_OINTMENT | Freq: Two times a day (BID) | CUTANEOUS | 0 refills | Status: DC

## 2016-02-01 NOTE — Telephone Encounter (Addendum)
-----  Message from Landis Martins, [place] sent at 02/01/2016 10:16 AM EST ----- Regarding: Rheum Consult Chronic left foot tendonitis and fasciitis. MRI suggestive of synovitis and fasciitis, no improvement with conservative treatment and PT. + HLA-B27 and family history of Lupus  Please see patient as soon as possible thanks Dr. Cannon Kettle. Required referral sheet pt demographic and clinicals faxed to [facility].

## 2016-02-01 NOTE — Progress Notes (Signed)
Subjective: Lori Stephens is a 40 y.o. diabetic female patient who returns to office for evaluation of left foot pain and for discussion of lab results. Patient states that she continues to have pain and could only tolerate the Jones compression wrap until Wednesday because of itching and because it was tight noticed new areas of redness rubbing. Patient denies any other pedal complaints at this time.    Patient Active Problem List   Diagnosis Date Noted  . Type 2 diabetes mellitus without complication ([place]) 60/11/9321  . Paronychia 11/02/2015  . Respiratory infection 11/02/2015  . Oral thrush 10/19/2015  . Allergic rhinitis 07/04/2015  . Anxiety and depression 05/03/2015  . Essential hypertension 05/03/2015    Current Outpatient Prescriptions on File Prior to Visit  Medication Sig Dispense Refill  . aspirin EC 81 MG tablet Take 81 mg by mouth daily.    . busPIRone (BUSPAR) 10 MG tablet     . cetirizine (ZYRTEC) 10 MG tablet Take 10 mg by mouth daily.    Marland Kitchen esomeprazole (NEXIUM) 40 MG capsule     . fluticasone (FLONASE) 50 MCG/ACT nasal spray     . insulin NPH Human (NOVOLIN N) 100 UNIT/ML injection Inject 1 mL (100 Units total) into the skin every morning. 40 mL 11  . levocetirizine (XYZAL) 5 MG tablet Take 5 mg by mouth every evening.    . metoprolol (LOPRESSOR) 100 MG tablet     . RELION INSULIN SYRINGE 29G X 1/2" 1 ML MISC     . sitaGLIPtin (JANUVIA) 100 MG tablet Take 1 tablet (100 mg total) by mouth daily. 30 tablet 11  . vitamin B-6 (PYRIDOXINE) 25 MG tablet Take 25 mg by mouth daily.     No current facility-administered medications on file prior to visit.     Allergies  Allergen Reactions  . Guaifenesin Itching  . Prednisone Other (See Comments)    Makes her emotional/cries alot Makes her emotional/cries alot Insomnia  . Clarithromycin Nausea And Vomiting  . Codeine Itching    Hives Hives Hives    Objective:  General: Alert and oriented x3 in no acute  distress  Dermatology: No open lesions bilateral lower extremities, small petichae on left lower leg And abrasion to anterior ankle on left with no signs of infection, no webspace macerations, no ecchymosis bilateral, all nails x 10 are well manicured.  Vascular: Dorsalis Pedis and Posterior Tibial pedal pulses palpable, Capillary Fill Time 3 seconds,(+) pedal hair growth bilateral, trace edema bilateral, Temperature gradient within normal limits.  Neurology: Johney Maine sensation intact via light touch bilateral, Protective sensation intact with Thornell Mule Monofilament to all pedal sites, Position sense intact, vibratory slightly diminished bilateral.  Musculoskeletal: Unchanged from prior, Mild tenderness with palpation plantar lateral heel and peroneal tendon> PT course left foot with associated weakness on range of motion, there is mild guarding to left foot and ankle pain, worse with stressing midtarsal joint on left. No pain with calf compression bilateral.   Gait: Antalgic gait  Lab results reviewed positive HLA-B27  Assessment and Plan: Problem List Items Addressed This Visit    None    Visit Diagnoses    Rash    -  Primary   Relevant Medications   triamcinolone ointment (KENALOG) 0.5 %   Peroneal tendonitis of left lower extremity       Relevant Medications   meloxicam (MOBIC) 15 MG tablet   Posterior tibial tendinitis of left leg       Relevant Medications  meloxicam (MOBIC) 15 MG tablet   Fasciitis       Relevant Medications   meloxicam (MOBIC) 15 MG tablet   Muscle tear       Relevant Medications   meloxicam (MOBIC) 15 MG tablet   Pain       Relevant Medications   meloxicam (MOBIC) 15 MG tablet   Uncontrolled diabetes mellitus type 2 without complications, unspecified long term insulin use status (HCC)       Relevant Medications   meloxicam (MOBIC) 15 MG tablet   Abrasion          -Complete examination performed -Discussed continued care for tendinitis with  tear of muscle belly -Prescribed triamcinolone cream to apply to areas of irritation at left lower extremity -Consult placed to rheumatology for positive HLA-B27 and because of positive family history of lupus -Continue with Mobic; refill provided at today's visit. Patient did not want to try tramadol because it makes her feel sleepy when she has had before in the past. -Continue with Temporary handicap. Advised patient to refrain from excessive walking/activity to prevent flare up. Patient also advised to continue with CAM boot, but may slowly wean as tolerated to normal Good supportive tennis shoe. -Patient to return to office after seen by rheumatology or sooner if problems or issues arise.   Landis Martins, DPM

## 2016-02-11 NOTE — Telephone Encounter (Addendum)
-----  Message from Manfred Shirts sent at 02/08/2016  1:12 PM EST ----- Regarding: Referral Contact: [phone] Patient wants to be referred to another Rheumatology practice due to [facility] not having an appt until late January. 02/11/2016-Faxed referral, clinicals and Demographics to Dr. Charlette Caffey faxes. 02/27/2016-[facility] states needs copy of pt's last labs. I called [facility] - Olivia Mackie states pt must have taken requisition to [facility] because only ANA and HLA B-27 were run, and she faxed to [facility]. Faxed to [place]: [place].

## 2016-03-28 ENCOUNTER — Ambulatory Visit: Payer: BLUE CROSS/BLUE SHIELD | Admitting: Endocrinology

## 2016-04-08 ENCOUNTER — Ambulatory Visit: Payer: BLUE CROSS/BLUE SHIELD | Admitting: Endocrinology

## 2016-04-24 ENCOUNTER — Ambulatory Visit (INDEPENDENT_AMBULATORY_CARE_PROVIDER_SITE_OTHER): Payer: BLUE CROSS/BLUE SHIELD | Admitting: Endocrinology

## 2016-04-24 ENCOUNTER — Encounter: Payer: Self-pay | Admitting: Endocrinology

## 2016-04-24 VITALS — BP 132/82 | HR 80 | Ht 62.0 in | Wt 239.0 lb

## 2016-04-24 DIAGNOSIS — Z794 Long term (current) use of insulin: Secondary | ICD-10-CM

## 2016-04-24 DIAGNOSIS — E119 Type 2 diabetes mellitus without complications: Secondary | ICD-10-CM

## 2016-04-24 LAB — POCT GLYCOSYLATED HEMOGLOBIN (HGB A1C): Hemoglobin A1C: 8.9

## 2016-04-24 MED ORDER — INSULIN NPH (HUMAN) (ISOPHANE) 100 UNIT/ML ~~LOC~~ SUSP: 120.0000 [IU] | SUBCUTANEOUS | 11 refills | Status: DC

## 2016-04-24 NOTE — Progress Notes (Signed)
Subjective:    Patient ID: Lori Stephens, female    DOB: 06-27-1975, 41 y.o.   MRN: [id number]  HPI Pt returns for f/u of diabetes mellitus: DM type: Insulin-requiring type 2 Dx'ed: 2009 Complications: none Therapy: insulin since dx GDM: never DKA: never Severe hypoglycemia: never.   Pancreatitis: never Other: She did not tolerate metformin-XR (diarrhea); she declines multiple daily injections; she declines bariatric surgery; she takes human insulin, due to cost. Interval history: she brings a record of her cbg's which i have reviewed today.  it varies from 107-424.  It is in general higher as the day goes on.  She had a steroid injection 1 month ago, for acute bronchitis.  Past Medical History:  Diagnosis Date  . Allergy   . Diabetes mellitus without complication (HCC)   . 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 Hx of migraines   . Ovarian cyst     No past surgical history on file.  Social History   Social History  . Marital status: Married    Spouse name: N/A  . Number of children: N/A  . Years of education: N/A   Occupational History  . Not on file.   Social History Main Topics  . Smoking status: Never Smoker  . Smokeless tobacco: Never Used  . Alcohol use Not on file  . Drug use: Unknown  . Sexual activity: Not on file   Other Topics Concern  . Not on file   Social History Narrative  . No narrative on file    Current Outpatient Prescriptions on File Prior to Visit  Medication Sig Dispense Refill  . aspirin EC 81 MG tablet Take 81 mg by mouth daily.    . busPIRone (BUSPAR) 10 MG tablet     . cetirizine (ZYRTEC) 10 MG tablet Take 10 mg by mouth daily.    Marland Kitchen. esomeprazole (NEXIUM) 40 MG capsule     . fluticasone (FLONASE) 50 MCG/ACT nasal spray     . levocetirizine (XYZAL) 5 MG tablet Take 5 mg by mouth every evening.    . metoprolol (LOPRESSOR) 100 MG tablet     . RELION INSULIN SYRINGE 29G X 1/2" 1 ML MISC     . sitaGLIPtin (JANUVIA) 100  MG tablet Take 1 tablet (100 mg total) by mouth daily. 30 tablet 11  . triamcinolone ointment (KENALOG) 0.5 % Apply 1 application topically 2 (two) times daily. 30 g 0  . vitamin B-6 (PYRIDOXINE) 25 MG tablet Take 25 mg by mouth daily.     No current facility-administered medications on file prior to visit.     Allergies  Allergen Reactions  . Guaifenesin Itching  . Prednisone Other (See Comments)    Makes her emotional/cries alot Makes her emotional/cries alot Insomnia  . Clarithromycin Nausea And Vomiting  . Codeine Itching    Hives Hives Hives    Family History  Problem Relation Age of Onset  . Diabetes Mother   . Diabetes Father     BP 132/82   Pulse 80   Ht 5\' 2"  (1.575 m)   Wt 239 lb (108.4 kg)   SpO2 97%   BMI 43.71 kg/m   Review of Systems She denies hypoglycemia.      Objective:   Physical Exam VITAL SIGNS:  See vs page.  GENERAL: no distress.  Pulses: dorsalis pedis intact bilat.   MSK: no deformity of the feet CV: no leg edema Skin:  no ulcer on the feet.  normal color and temp on the feet. Neuro: sensation is intact to touch on the feet.   A1c=8.9%    Assessment & Plan:  Insulin-requiring type 2 DM: she needs increased rx.    Patient is advised the following: Patient Instructions  check your blood sugar twice a day.  vary the time of day when you check, between before the 3 meals, and at bedtime.  also check if you have symptoms of your blood sugar being too high or too low.  please keep a record of the readings and bring it to your next appointment here (or you can bring the meter itself).  You can write it on any piece of paper.  please call us sooner if your blood sugar goes below 70, or if you have a lot of readings over 200. Please increase the NPH to 120 units each morning, and:  Please continue the same [place].   Please come back for a follow-up appointment in 2 months.

## 2016-04-24 NOTE — Patient Instructions (Addendum)
check your blood sugar twice a day.  vary the time of day when you check, between before the 3 meals, and at bedtime.  also check if you have symptoms of your blood sugar being too high or too low.  please keep a record of the readings and bring it to your next appointment here (or you can bring the meter itself).  You can write it on any piece of paper.  please call us sooner if your blood sugar goes below 70, or if you have a lot of readings over 200. Please increase the NPH to 120 units each morning, and:  Please continue the same [place].   Please come back for a follow-up appointment in 2 months.

## 2016-07-01 ENCOUNTER — Ambulatory Visit: Payer: BLUE CROSS/BLUE SHIELD | Admitting: Endocrinology

## 2016-07-03 ENCOUNTER — Encounter: Payer: Self-pay | Admitting: Endocrinology

## 2016-07-03 ENCOUNTER — Ambulatory Visit (INDEPENDENT_AMBULATORY_CARE_PROVIDER_SITE_OTHER): Payer: BLUE CROSS/BLUE SHIELD | Admitting: Endocrinology

## 2016-07-03 ENCOUNTER — Telehealth: Payer: Self-pay | Admitting: Endocrinology

## 2016-07-03 VITALS — BP 130/88 | HR 83 | Ht 63.0 in | Wt 243.0 lb

## 2016-07-03 DIAGNOSIS — E119 Type 2 diabetes mellitus without complications: Secondary | ICD-10-CM

## 2016-07-03 DIAGNOSIS — Z794 Long term (current) use of insulin: Secondary | ICD-10-CM | POA: Diagnosis not present

## 2016-07-03 LAB — POCT GLYCOSYLATED HEMOGLOBIN (HGB A1C): HEMOGLOBIN A1C: 9.5

## 2016-07-03 MED ORDER — INSULIN NPH (HUMAN) (ISOPHANE) 100 UNIT/ML ~~LOC~~ SUSP: 110.0000 [IU] | SUBCUTANEOUS | 11 refills | Status: DC

## 2016-07-03 MED ORDER — INSULIN REGULAR HUMAN 100 UNIT/ML IJ SOLN: 20.0000 [IU] | Freq: Every day | INTRAMUSCULAR | 11 refills | Status: DC

## 2016-07-03 NOTE — Progress Notes (Signed)
Subjective:    Patient ID: Lori Stephens, female    DOB: 12-28-1975, 41 y.o.   MRN: [id number]  HPI Pt returns for f/u of diabetes mellitus: DM type: Insulin-requiring type 2 Dx'ed: 2009 Complications: polyneuropathy Therapy: insulin since dx GDM: never DKA: never Severe hypoglycemia: never.   Pancreatitis: never Other: She did not tolerate metformin-XR (diarrhea); she declines multiple daily injections; she takes human insulin, due to cost.  Interval history: she brings a record of her cbg's which i have reviewed today.  it varies from 75-300.  It is in general lowest at lunch, due to a small breakfast, and highest at HS.  No recent steroids.  Past Medical History:  Diagnosis Date  . Allergy   . Diabetes mellitus without complication (HCC)   . 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 Hx of migraines   . Ovarian cyst     No past surgical history on file.  Social History   Social History  . Marital status: Married    Spouse name: N/A  . Number of children: N/A  . Years of education: N/A   Occupational History  . Not on file.   Social History Main Topics  . Smoking status: Never Smoker  . Smokeless tobacco: Never Used  . Alcohol use Not on file  . Drug use: Unknown  . Sexual activity: Not on file   Other Topics Concern  . Not on file   Social History Narrative  . No narrative on file    Current Outpatient Prescriptions on File Prior to Visit  Medication Sig Dispense Refill  . aspirin EC 81 MG tablet Take 81 mg by mouth daily.    . busPIRone (BUSPAR) 10 MG tablet     . cetirizine (ZYRTEC) 10 MG tablet Take 10 mg by mouth daily.    Marland Kitchen esomeprazole (NEXIUM) 40 MG capsule     . metoprolol (LOPRESSOR) 100 MG tablet     . RELION INSULIN SYRINGE 29G X 1/2" 1 ML MISC     . sitaGLIPtin (JANUVIA) 100 MG tablet Take 1 tablet (100 mg total) by mouth daily. 30 tablet 11  . triamcinolone ointment (KENALOG) 0.5 % Apply 1 application topically 2 (two) times  daily. 30 g 0  . vitamin B-6 (PYRIDOXINE) 25 MG tablet Take 25 mg by mouth daily.    . DULoxetine (CYMBALTA) 20 MG capsule Take 20 mg by mouth daily.    . fluticasone (FLONASE) 50 MCG/ACT nasal spray     . levocetirizine (XYZAL) 5 MG tablet Take 5 mg by mouth every evening.    . nitrofurantoin, macrocrystal-monohydrate, (MACROBID) 100 MG capsule Take 100 mg by mouth 2 (two) times daily.     No current facility-administered medications on file prior to visit.     Allergies  Allergen Reactions  . Guaifenesin Itching  . Prednisone Other (See Comments)    Makes her emotional/cries alot Makes her emotional/cries alot Insomnia  . Clarithromycin Nausea And Vomiting  . Codeine Itching    Hives Hives Hives    Family History  Problem Relation Age of Onset  . Diabetes Mother   . Diabetes Father     BP 130/88   Pulse 83   Ht 5\' 3"  (1.6 m)   Wt 243 lb (110.2 kg)   SpO2 94%   BMI 43.05 kg/m    Review of Systems She denies hypoglycemia.  Denies SI.     Objective:   Physical Exam VITAL SIGNS:  See vs page GENERAL: no distress Pulses: dorsalis pedis intact bilat.   MSK: no deformity of the feet CV: no leg edema Skin:  no ulcer on the feet.  normal color and temp on the feet. Neuro: sensation is intact to touch on the feet.   PSYCH: tearful.   a1c=9.5%    Assessment & Plan:  Insulin-requiring type 2 DM, with polyneuropathy: worse.  We discussed.  She agrees to take 2 QD insulins.   Depression.  This complicates the rx of DM.  Pt is advised to continue to work with PCP.   Obesity: persistent.  Patient Instructions  check your blood sugar twice a day.  vary the time of day when you check, between before the 3 meals, and at bedtime.  also check if you have symptoms of your blood sugar being too high or too low.  please keep a record of the readings and bring it to your next appointment here (or you can bring the meter itself).  You can write it on any piece of paper.  please  call us sooner if your blood sugar goes below 70, or if you have a lot of readings over 200. Please decrease the NPH to 110 units each morning.  Try taking it at 9 AM instead of 7, to avoid lows.  I have sent a prescription to your pharmacy, to add Regular insulin, 20 units with supper Please continue the same [place]ia.   Please come back for a follow-up appointment in 2 months.    Bariatric Surgery You have so much to gain by losing weight.  You may have already tried every diet and exercise plan imaginable.  And, you may have sought advice from your family physician, too.   Sometimes, in spite of such diligent efforts, you may not be able to achieve long-term results by yourself.  In cases of severe obesity, bariatric or weight loss surgery is a proven method of achieving long-term weight control.  Our Services Our bariatric surgery programs offer our patients new hope and long-term weight-loss solution.  Since introducing our services in 2003, we have conducted more than 2,400 successful procedures.  Our program is designated as a Investment banker, corporateComprehensive Center by the Metabolic and Bariatric Surgery Accreditation and Quality Improvement Program (MBSAQIP), a Child psychotherapistnational accrediting body that sets rigorous patient safety and outcome standards.  Our program is also designated as a Engineer, manufacturing systemsCenter of Excellence by [employer] insurance companies.   Our exceptional weight-loss surgery team specializes in diagnosis, treatment, follow-up care, and ongoing support for our patients with severe weight loss challenges.  We currently offer laparoscopic sleeve gastrectomy, gastric bypass, and adjustable gastric band (LAP-BAND).    Attend our Bariatrics Seminar Choosing to undergo a bariatric procedure is a big decision, and one that should not be taken lightly.  You now have two options in how you learn about weight-loss surgery - in person or online.  Our objective is to ensure you have all of the information that you need to evaluate  the advantages and obligations of this life changing procedure.  Please note that you are not alone in this process, and our experienced team is ready to assist and answer all of your questions.  There are several ways to register for a seminar (either on-line or in person): 1)  Call [phone] 2) Go on-line to [facility] and register for either type of seminar.  [email or web address]

## 2016-07-03 NOTE — Telephone Encounter (Signed)
[facility] calling about the novolin n rx it will be $65 but if we give them the ok the humulin N is $35  Please advise # [phone]

## 2016-07-03 NOTE — Patient Instructions (Addendum)
check your blood sugar twice a day.  vary the time of day when you check, between before the 3 meals, and at bedtime.  also check if you have symptoms of your blood sugar being too high or too low.  please keep a record of the readings and bring it to your next appointment here (or you can bring the meter itself).  You can write it on any piece of paper.  please call us sooner if your blood sugar goes below 70, or if you have a lot of readings over 200. Please decrease the NPH to 110 units each morning.  Try taking it at 9 AM instead of 7, to avoid lows.  I have sent a prescription to your pharmacy, to add Regular insulin, 20 units with supper Please continue the same [place]ia.   Please come back for a follow-up appointment in 2 months.    Bariatric Surgery You have so much to gain by losing weight.  You may have already tried every diet and exercise plan imaginable.  And, you may have sought advice from your family physician, too.   Sometimes, in spite of such diligent efforts, you may not be able to achieve long-term results by yourself.  In cases of severe obesity, bariatric or weight loss surgery is a proven method of achieving long-term weight control.  Our Services Our bariatric surgery programs offer our patients new hope and long-term weight-loss solution.  Since introducing our services in 2003, we have conducted more than 2,400 successful procedures.  Our program is designated as a Investment banker, corporateComprehensive Center by the Metabolic and Bariatric Surgery Accreditation and Quality Improvement Program (MBSAQIP), a Child psychotherapistnational accrediting body that sets rigorous patient safety and outcome standards.  Our program is also designated as a Engineer, manufacturing systemsCenter of Excellence by [employer] insurance companies.   Our exceptional weight-loss surgery team specializes in diagnosis, treatment, follow-up care, and ongoing support for our patients with severe weight loss challenges.  We currently offer laparoscopic sleeve gastrectomy, gastric  bypass, and adjustable gastric band (LAP-BAND).    Attend our Bariatrics Seminar Choosing to undergo a bariatric procedure is a big decision, and one that should not be taken lightly.  You now have two options in how you learn about weight-loss surgery - in person or online.  Our objective is to ensure you have all of the information that you need to evaluate the advantages and obligations of this life changing procedure.  Please note that you are not alone in this process, and our experienced team is ready to assist and answer all of your questions.  There are several ways to register for a seminar (either on-line or in person): 1)  Call [phone] 2) Go on-line to [facility] and register for either type of seminar.  [email or web address]

## 2016-07-03 NOTE — Telephone Encounter (Signed)
Pharmacy was notified of the change from novolin to humulin

## 2016-09-02 ENCOUNTER — Ambulatory Visit: Payer: BLUE CROSS/BLUE SHIELD | Admitting: Endocrinology

## 2016-09-11 ENCOUNTER — Telehealth: Payer: Self-pay | Admitting: Endocrinology

## 2016-09-11 NOTE — Telephone Encounter (Signed)
Routing to you °

## 2016-09-11 NOTE — Telephone Encounter (Signed)
Called and informed patient she did not have to continue the Januvia. Told patient she could just continue insulin dose & call to adjust if readings get too high. She has made appointment for Aug. 23.

## 2016-09-11 NOTE — Telephone Encounter (Signed)
We can find an alternative.  However, if I was you, I would just leave it off, and adjust the insulin if your blood sugar goes up off it.

## 2016-09-11 NOTE — Telephone Encounter (Signed)
Patient returned missed call in reference to note below. Please call and advise. OK to leave message on [phone] if no answer.

## 2016-09-11 NOTE — Telephone Encounter (Signed)
Patient's insurance has changed and needs to know if the office has sitaGLIPtin (JANUVIA) 100 MG tablet as samples or an alternative that she can pay in cash for. Call patient on mobile phone [phone] to advise.

## 2016-09-11 NOTE — Telephone Encounter (Signed)
See message and please advise, Thanks!  

## 2016-10-10 ENCOUNTER — Ambulatory Visit: Payer: BLUE CROSS/BLUE SHIELD | Admitting: Endocrinology

## 2016-10-16 ENCOUNTER — Telehealth: Payer: Self-pay

## 2016-10-16 ENCOUNTER — Ambulatory Visit (INDEPENDENT_AMBULATORY_CARE_PROVIDER_SITE_OTHER): Payer: BLUE CROSS/BLUE SHIELD | Admitting: Endocrinology

## 2016-10-16 ENCOUNTER — Encounter: Payer: Self-pay | Admitting: Endocrinology

## 2016-10-16 VITALS — BP 122/72 | HR 67 | Wt 235.0 lb

## 2016-10-16 DIAGNOSIS — E119 Type 2 diabetes mellitus without complications: Secondary | ICD-10-CM

## 2016-10-16 DIAGNOSIS — Z0000 Encounter for general adult medical examination without abnormal findings: Secondary | ICD-10-CM | POA: Insufficient documentation

## 2016-10-16 DIAGNOSIS — Z794 Long term (current) use of insulin: Secondary | ICD-10-CM | POA: Diagnosis not present

## 2016-10-16 LAB — LIPID PANEL
CHOL/HDL RATIO: 4
CHOLESTEROL: 165 mg/dL (ref 0–200)
HDL: 38.8 mg/dL — ABNORMAL LOW (ref >39.00)
LDL CALC: 108 mg/dL — AB (ref 0–99)
NonHDL: 126.12
Triglycerides: 92 mg/dL (ref 0.0–149.0)
VLDL: 18.4 mg/dL (ref 0.0–40.0)

## 2016-10-16 LAB — BASIC METABOLIC PANEL
BUN: 10 mg/dL (ref 6–23)
CALCIUM: 9.1 mg/dL (ref 8.4–10.5)
CO2: 30 mEq/L (ref 19–32)
Chloride: 100 mEq/L (ref 96–112)
Creatinine, Ser: 0.73 mg/dL (ref 0.40–1.20)
GFR: 93.88 mL/min (ref >60.00)
GLUCOSE: 245 mg/dL — AB (ref 70–99)
Potassium: 4.2 mEq/L (ref 3.5–5.1)
SODIUM: 136 meq/L (ref 135–145)

## 2016-10-16 LAB — POCT GLYCOSYLATED HEMOGLOBIN (HGB A1C): HEMOGLOBIN A1C: 10.4

## 2016-10-16 LAB — TSH: TSH: 2.54 u[IU]/mL (ref 0.35–4.50)

## 2016-10-16 MED ORDER — INSULIN REGULAR HUMAN 100 UNIT/ML IJ SOLN: 20.0000 [IU] | Freq: Every day | INTRAMUSCULAR | 11 refills | Status: AC

## 2016-10-16 MED ORDER — INSULIN NPH (HUMAN) (ISOPHANE) 100 UNIT/ML ~~LOC~~ SUSP: 110.0000 [IU] | SUBCUTANEOUS | 11 refills | Status: AC

## 2016-10-16 NOTE — Progress Notes (Signed)
Subjective:    Patient ID: Lori Stephens, female    DOB: 25-Jan-1976, 41 y.o.   MRN: [id number]  HPI Pt returns for f/u of diabetes mellitus: DM type: Insulin-requiring type 2 Dx'ed: 2009 Complications: polyneuropathy Therapy: insulin since dx GDM: never DKA: never Severe hypoglycemia: never.   Pancreatitis: never Other: She did not tolerate metformin-XR (diarrhea); she declines multiple daily injections; she takes human insulin, due to cost.  Interval history: Pt says she ran out of insulin 2 days ago.  no cbg record, but states while on the insulin, cbg's varied from 100's-200's.  It is in general higher as the day goes on.  Past Medical History:  Diagnosis Date  . Allergy   . Diabetes mellitus without complication (HCC)   . 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 Hx of migraines   . Ovarian cyst     No past surgical history on file.  Social History   Social History  . Marital status: Married    Spouse name: N/A  . Number of children: N/A  . Years of education: N/A   Occupational History  . Not on file.   Social History Main Topics  . Smoking status: Never Smoker  . Smokeless tobacco: Never Used  . Alcohol use Not on file  . Drug use: Unknown  . Sexual activity: Not on file   Other Topics Concern  . Not on file   Social History Narrative  . No narrative on file    Current Outpatient Prescriptions on File Prior to Visit  Medication Sig Dispense Refill  . aspirin EC 81 MG tablet Take 81 mg by mouth daily.    . busPIRone (BUSPAR) 10 MG tablet     . cetirizine (ZYRTEC) 10 MG tablet Take 10 mg by mouth daily.    Marland Kitchen esomeprazole (NEXIUM) 40 MG capsule     . LYRICA 75 MG capsule     . metoprolol (LOPRESSOR) 100 MG tablet     . RELION INSULIN SYRINGE 29G X 1/2" 1 ML MISC     . triamcinolone ointment (KENALOG) 0.5 % Apply 1 application topically 2 (two) times daily. 30 g 0  . vitamin B-6 (PYRIDOXINE) 25 MG tablet Take 25 mg by mouth daily.    .  DULoxetine (CYMBALTA) 20 MG capsule Take 20 mg by mouth daily.    . fluticasone (FLONASE) 50 MCG/ACT nasal spray     . levocetirizine (XYZAL) 5 MG tablet Take 5 mg by mouth every evening.    . nitrofurantoin, macrocrystal-monohydrate, (MACROBID) 100 MG capsule Take 100 mg by mouth 2 (two) times daily.     No current facility-administered medications on file prior to visit.     Allergies  Allergen Reactions  . Guaifenesin Itching  . Prednisone Other (See Comments)    Makes her emotional/cries alot Makes her emotional/cries alot Insomnia  . Clarithromycin Nausea And Vomiting  . Codeine Itching    Hives Hives Hives    Family History  Problem Relation Age of Onset  . Diabetes Mother   . Diabetes Father     BP 122/72   Pulse 67   Wt 235 lb (106.6 kg)   SpO2 98%   BMI 41.63 kg/m   Review of Systems She has lost 8 lbs.     Objective:   Physical Exam VITAL SIGNS:  See vs page GENERAL: no distress Pulses: foot pulses are intact bilaterally.   MSK: no deformity of the feet or ankles.  CV: no edema of the legs or ankles Skin:  no ulcer on the feet or ankles.  normal color and temp on the feet and ankles Neuro: sensation is intact to touch on the feet and ankles.    Lab Results  Component Value Date   HGBA1C 10.4 10/16/2016      Assessment & Plan:  Insulin-requiring type 2 DM, with polyneuropathy: worse Noncompliance with cbg recording and insulin: I'll work around this as best I can Weight loss, prob due to severe hyperglycemia.  We'll follow.   Patient Instructions  check your blood sugar twice a day.  vary the time of day when you check, between before the 3 meals, and at bedtime.  also check if you have symptoms of your blood sugar being too high or too low.  please keep a record of the readings and bring it to your next appointment here (or you can bring the meter itself).  You can write it on any piece of paper.  please call us sooner if your blood sugar goes  below 70, or if you have a lot of readings over 200. I have sent a prescriptions to [facility], to see if you can get them for free. blood tests are requested for you today.  We'll let you know about the results.  Please come back for a follow-up appointment in 2 months.

## 2016-10-16 NOTE — Telephone Encounter (Signed)
LVM, gave lab results. Gave call back number if any questions or concerns. Gave call back number to advise if okay to send RX.

## 2016-10-16 NOTE — Patient Instructions (Addendum)
check your blood sugar twice a day.  vary the time of day when you check, between before the 3 meals, and at bedtime.  also check if you have symptoms of your blood sugar being too high or too low.  please keep a record of the readings and bring it to your next appointment here (or you can bring the meter itself).  You can write it on any piece of paper.  please call us sooner if your blood sugar goes below 70, or if you have a lot of readings over 200. I have sent a prescriptions to [facility], to see if you can get them for free. blood tests are requested for you today.  We'll let you know about the results.  Please come back for a follow-up appointment in 2 months.

## 2016-10-16 NOTE — Telephone Encounter (Signed)
-----   Message from Romero Belling, MD sent at 10/16/2016  1:22 PM EDT ----- please call patient: Good results, except bad cholesterol is 108, which is high. Please let me know if you want a prescription for this.  It is a cheap generic.

## 2016-12-16 ENCOUNTER — Ambulatory Visit: Payer: BLUE CROSS/BLUE SHIELD | Admitting: Endocrinology

## 2017-01-06 ENCOUNTER — Ambulatory Visit: Payer: BLUE CROSS/BLUE SHIELD | Admitting: Endocrinology

## 2018-08-29 ENCOUNTER — Ambulatory Visit (HOSPITAL_COMMUNITY)
Admission: EM | Admit: 2018-08-29 | Discharge: 2018-08-29 | Disposition: A | Discharge status: Home / Self Care | Payer: BC Managed Care – PPO | Attending: Family Medicine | Admitting: Family Medicine

## 2018-08-29 ENCOUNTER — Encounter (HOSPITAL_COMMUNITY): Payer: Self-pay

## 2018-08-29 ENCOUNTER — Other Ambulatory Visit: Payer: Self-pay

## 2018-08-29 ENCOUNTER — Ambulatory Visit (INDEPENDENT_AMBULATORY_CARE_PROVIDER_SITE_OTHER): Payer: BC Managed Care – PPO

## 2018-08-29 DIAGNOSIS — I2510 Atherosclerotic heart disease of native coronary artery without angina pectoris: Secondary | ICD-10-CM

## 2018-08-29 DIAGNOSIS — S93491D Sprain of other ligament of right ankle, subsequent encounter: Secondary | ICD-10-CM

## 2018-08-29 DIAGNOSIS — E1140 Type 2 diabetes mellitus with diabetic neuropathy, unspecified: Secondary | ICD-10-CM

## 2018-08-29 DIAGNOSIS — K219 Gastro-esophageal reflux disease without esophagitis: Secondary | ICD-10-CM

## 2018-08-29 DIAGNOSIS — E785 Hyperlipidemia, unspecified: Secondary | ICD-10-CM

## 2018-08-29 DIAGNOSIS — Z955 Presence of coronary angioplasty implant and graft: Secondary | ICD-10-CM

## 2018-08-29 MED ORDER — HYDROCODONE-ACETAMINOPHEN 5-325 MG PO TABS: 1.0000 | ORAL_TABLET | Freq: Four times a day (QID) | ORAL | 0 refills | Status: AC | PRN

## 2018-08-29 NOTE — ED Provider Notes (Signed)
[facility]    CSN: [id number] Arrival date & time: 08/29/18  1258      History   Chief Complaint Chief Complaint  Patient presents with  . Ankle Pain    HPI Lori Stephens is a 43 y.o. female.   HPI  States she had a fall.  Twisted her right ankle 3 weeks ago.  Could not bear weight.  [facility].  Placed into a cam walker.  After 2 weeks was supposed to wean into a brace.  She took her foot out of the brace and was doing range of motion exercises when she felt a pop and severe pain in her ankle.  Now she cannot bear weight again.  Swollen again.  Is worried about fracture.  Is worried that her fracture was missed last time.  Desires x-ray. Patient is a poorly controlled insulin-dependent diabetic with a hemoglobin A1c that runs around 11.  Her chart is updated.  Medicines are updated.  Since she was last seen in our healthcare system she is had an MI, cardiac cath and stent placement.  She is now on Brilinta and Crestor and losartan in addition to the medications documented.  Medicines are updated and reconciled.   Past Medical History:  Diagnosis Date  . Allergy   . Diabetes mellitus without complication (HCC)   . 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 Hx of migraines   . Ovarian cyst     Patient Active Problem List   Diagnosis Date Noted  . Diabetic neuropathy (HCC) 08/29/2018  . GERD (gastroesophageal reflux disease) 08/29/2018  . CAD (coronary artery disease) 08/29/2018  . S/P primary angioplasty with coronary stent 08/29/2018  . HLD (hyperlipidemia) 08/29/2018  . Type 2 diabetes mellitus with diabetic neuropathy (HCC) 11/06/2015  . Paronychia 11/02/2015  . Respiratory infection 11/02/2015  . Oral thrush 10/19/2015  . Allergic rhinitis 07/04/2015  . Anxiety and depression 05/03/2015  . Essential hypertension 05/03/2015    History reviewed. No pertinent surgical history.  OB History   No obstetric  history on file.      Home Medications    Prior to Admission medications   Medication Sig Start Date End Date Taking? Authorizing Provider  losartan (COZAAR) 50 MG tablet Take 50 mg by mouth daily.   Yes [provider]  rosuvastatin (CRESTOR) 20 MG tablet Take 20 mg by mouth daily.   Yes [provider]  sertraline (ZOLOFT) 100 MG tablet Take 100 mg by mouth daily.   Yes [provider]  ticagrelor (BRILINTA) 90 MG TABS tablet Take 90 mg by mouth 2 (two) times daily.   Yes [provider]  aspirin EC 81 MG tablet Take 81 mg by mouth daily.    [provider]  busPIRone (BUSPAR) 10 MG tablet  10/07/15   [provider]  cetirizine (ZYRTEC) 10 MG tablet Take 10 mg by mouth daily.    [provider]  esomeprazole (NEXIUM) 40 MG capsule  09/18/15   [provider]  fluticasone Aleda Grana(FLONASE) 50 MCG/ACT nasal spray  09/18/15   [provider]  gabapentin (NEURONTIN) 300 MG capsule Take 300 mg by mouth 3 (three) times daily.    [provider]  HYDROcodone-acetaminophen (NORCO/VICODIN) 5-325 MG tablet Take 1-2 tablets by mouth every 6 (six) hours as needed. 08/29/18   Lori Stephens, Lori Gurganus Sue, MD  insulin NPH Human (HUMULIN N,NOVOLIN N) 100 UNIT/ML injection Inject 1.1 mLs (110  Units total) into the skin every morning. 10/16/16   Romero Stephens, Sean, MD  insulin regular (NOVOLIN R RELION) 100 units/mL injection Inject 0.2 mLs (20 Units total) into the skin daily with supper. And syringes 3/day 10/16/16   Romero Stephens, Sean, MD  metoprolol (LOPRESSOR) 100 MG tablet  09/18/15   [provider]  RELION INSULIN SYRINGE 29G X 1/2" 1 ML MISC  08/15/15   [provider]  vitamin B-6 (PYRIDOXINE) 25 MG tablet Take 25 mg by mouth daily.    [provider]  DULoxetine (CYMBALTA) 20 MG capsule Take 20 mg by mouth daily.  08/29/18  [provider]  levocetirizine (XYZAL) 5 MG tablet Take 5 mg by mouth every evening.   08/29/18  [provider]  LYRICA 75 MG capsule  06/24/16 08/29/18  [provider]    Family History Family History  Problem Relation Age of Onset  . Diabetes Mother   . Diabetes Father     Social History Social History   Tobacco Use  . Smoking status: Never Smoker  . Smokeless tobacco: Never Used  Substance Use Topics  . Alcohol use: Not on file  . Drug use: Not on file     Allergies   Guaifenesin, Prednisone, Clarithromycin, and Codeine   Review of Systems Review of Systems  Constitutional: Negative for chills and fever.  HENT: Negative for ear pain and sore throat.   Eyes: Negative for pain and visual disturbance.  Respiratory: Negative for cough and shortness of breath.   Cardiovascular: Negative for chest pain and palpitations.  Gastrointestinal: Negative for abdominal pain and vomiting.  Genitourinary: Negative for dysuria and hematuria.  Musculoskeletal: Positive for arthralgias, gait problem and joint swelling. Negative for back pain.  Skin: Negative for color change and rash.  Neurological: Negative for seizures and syncope.  All other systems reviewed and are negative.    Physical Exam Triage Vital Signs ED Triage Vitals  Enc Vitals Group     BP 08/29/18 1318 128/70     Pulse Rate 08/29/18 1318 81     Resp 08/29/18 1318 18     Temp 08/29/18 1318 98.4 F (36.9 C)     Temp Source 08/29/18 1318 Oral     SpO2 08/29/18 1318 100 %     Weight --      Height --      Head Circumference --      Peak Flow --      Pain Score 08/29/18 1320 8     Pain Loc --      Pain Edu? --      Excl. in GC? --    No data found.  Updated Vital Signs BP 128/70 (BP Location: Right Arm)   Pulse 81   Temp 98.4 F (36.9 C) (Oral)   Resp 18   LMP  (LMP Unknown)   SpO2 100%       Physical Exam Constitutional:      General: She is not in acute distress.    Appearance: She is well-developed.     Comments: In wheelchair.  Appears uncomfortable  HENT:      Head: Normocephalic and atraumatic.  Eyes:     Conjunctiva/sclera: Conjunctivae normal.     Pupils: Pupils are equal, round, and reactive to light.  Neck:     Musculoskeletal: Normal range of motion.  Cardiovascular:     Rate and Rhythm: Normal rate and regular rhythm.     Heart sounds: Normal heart sounds.  Pulmonary:     Effort: Pulmonary effort is normal. No respiratory distress.     Breath sounds: Normal breath sounds.  Abdominal:     General: There is no distension.     Palpations: Abdomen is soft.  Musculoskeletal: Normal range of motion.     Comments: Ankle has full range of motion.  Tenderness over the lateral malleolus.  Tenderness over the ATFL.  No instability.  No tenderness of the Achilles tendon or its insertion.  No tenderness in the medial ankle.  Mild tenderness proximal palpation of the anterior ankle joint.  Skin:    General: Skin is warm and dry.     Comments: Covered in small scabs and bleeding wounds from skin picking  Neurological:     Mental Status: She is alert.      UC Treatments / Results  Labs (all labs ordered are listed, but only abnormal results are displayed) Labs Reviewed - No data to display  EKG   Radiology Dg Ankle Complete Right  Result Date: 08/29/2018 CLINICAL DATA:  Injury, pain EXAM: RIGHT ANKLE - COMPLETE 3+ VIEW COMPARISON:  None. FINDINGS: Normal alignment without acute osseous finding, fracture, subluxation or dislocation. Mildly like, talus and calcaneus intact. No joint abnormality. Small plantar calcaneal spur. Lower extremity soft tissue calcifications noted. IMPRESSION: No acute osseous finding. Electronically Signed   By: Jerilynn Mages.  Shick M.D.   On: 08/29/2018 14:04    Procedures Procedures (including critical care time)  Medications Ordered in UC Medications - No data to display  Initial Impression / Assessment and Plan / UC Course  I have reviewed the triage vital signs and the nursing notes.  Pertinent labs & imaging  results that were available during my care of the patient were reviewed by me and considered in my medical decision making (see chart for details).    X-rays are negative.  Discussed ankle sprain.  Go back to orthopedic Final Clinical Impressions(s) / UC Diagnoses   Final diagnoses:  Sprain of anterior talofibular ligament of right ankle, subsequent encounter     Discharge Instructions      Elevate foot and use ice to reduce pain and swelling Take pain medication as needed.  Do not drive on pain medication.  I noticed that codeine makes you itch, if you have itching on the hydrocodone just taking antihistamine like Benadryl Call [facility] tomorrow to see about follow-up   ED Prescriptions    Medication Sig Dispense Auth. Provider   HYDROcodone-acetaminophen (NORCO/VICODIN) 5-325 MG tablet Take 1-2 tablets by mouth every 6 (six) hours as needed. 10 tablet Raylene Everts, MD     Controlled Substance Prescriptions [place] Controlled Substance Registry consulted? Yes, I have consulted the [place] Controlled Substances Registry for this patient, and feel the risk/benefit ratio today is favorable for proceeding with this prescription for a controlled substance.   Raylene Everts, MD 08/29/18 [phone]

## 2018-08-29 NOTE — Discharge Instructions (Addendum)
Elevate foot and use ice to reduce pain and swelling Take pain medication as needed.  Do not drive on pain medication.  I noticed that codeine makes you itch, if you have itching on the hydrocodone just taking antihistamine like Benadryl Call [facility] tomorrow to see about follow-up

## 2018-08-29 NOTE — ED Triage Notes (Signed)
Pt C/O right ankle pain from a fall 3 weeks ago. Pt states she was treat at [facility]. So on last night she extended her right ankle and hard a popping sound and no she is unable to apply pressure or move foot around.

## 2021-03-03 IMAGING — DX RIGHT ANKLE - COMPLETE 3+ VIEW
3 series · 3 of 3 positions shown · non-contrast
Comparison: None.

CLINICAL DATA: Injury, pain

EXAM:
RIGHT ANKLE - COMPLETE 3+ VIEW

[ankle ap]
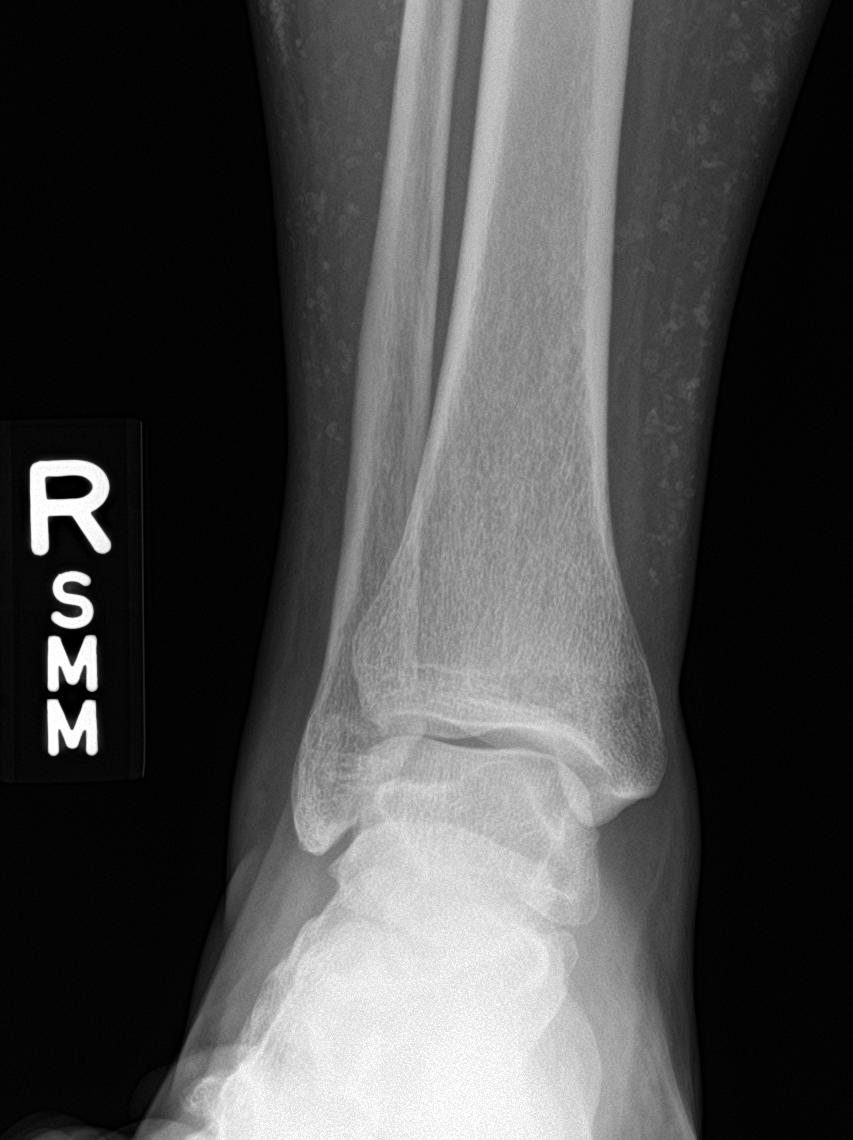

[ankle obl]
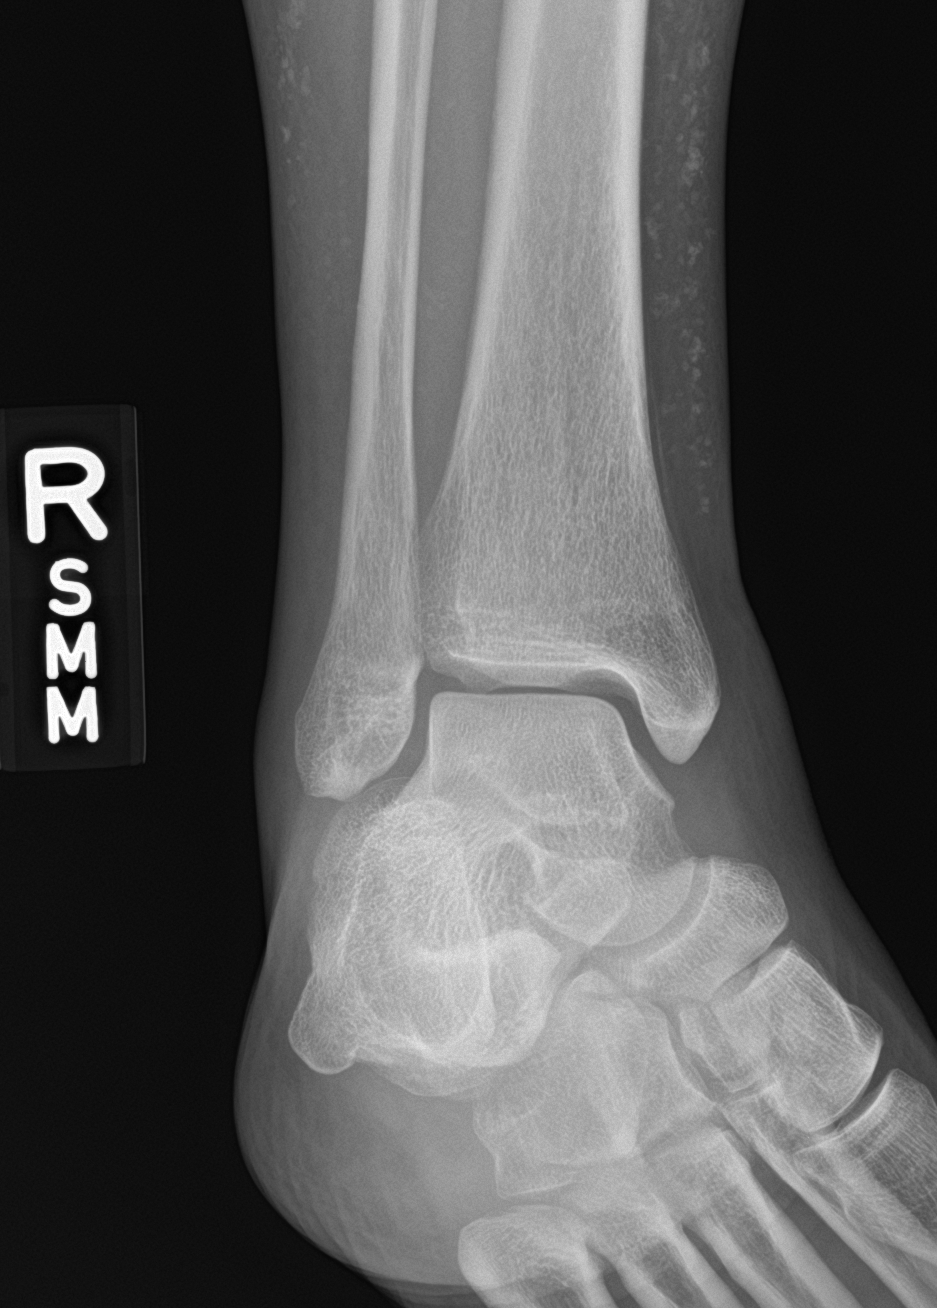

[ankle lat]
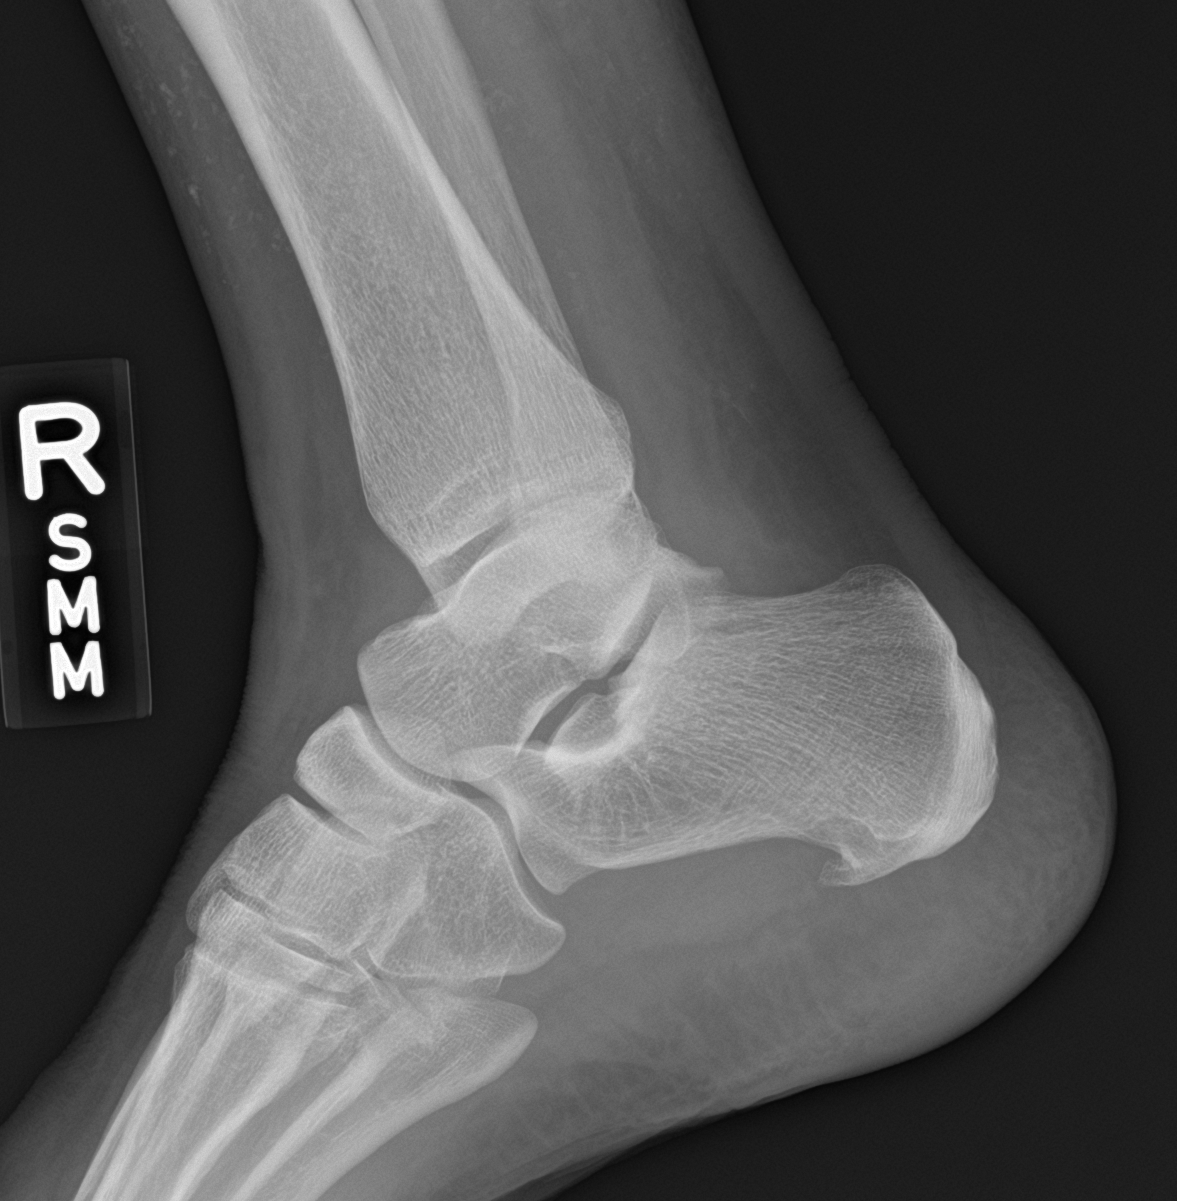

[3 of 3 positions shown; findings below may reference images not displayed]

FINDINGS: Normal alignment without acute osseous finding, fracture,
subluxation or dislocation. Mildly like, talus and calcaneus intact.
No joint abnormality. Small plantar calcaneal spur. Lower extremity
soft tissue calcifications noted.
IMPRESSION: No acute osseous finding.
